# Patient Record
Sex: Female | Born: 1985
Health system: Southern US, Community
[De-identification: ages and names within clinical notes are randomized; demographics above are authoritative.]

## PROBLEM LIST (undated history)

## (undated) DIAGNOSIS — K759 Inflammatory liver disease, unspecified: Secondary | ICD-10-CM

## (undated) DIAGNOSIS — F988 Other specified behavioral and emotional disorders with onset usually occurring in childhood and adolescence: Secondary | ICD-10-CM

## (undated) HISTORY — PX: TONSILLECTOMY AND ADENOIDECTOMY: SHX28

## (undated) HISTORY — PX: LIVER BIOPSY: SHX301

---

## 2014-04-07 ENCOUNTER — Emergency Department (HOSPITAL_COMMUNITY): Payer: BC Managed Care – PPO

## 2014-04-07 ENCOUNTER — Emergency Department (HOSPITAL_COMMUNITY)
Admission: EM | Admit: 2014-04-07 | Discharge: 2014-04-07 | Disposition: A | Discharge status: Home / Self Care | Payer: BC Managed Care – PPO | Attending: Emergency Medicine | Admitting: Emergency Medicine

## 2014-04-07 ENCOUNTER — Encounter (HOSPITAL_COMMUNITY): Payer: Self-pay

## 2014-04-07 DIAGNOSIS — R002 Palpitations: Secondary | ICD-10-CM | POA: Diagnosis not present

## 2014-04-07 DIAGNOSIS — Z91040 Latex allergy status: Secondary | ICD-10-CM | POA: Diagnosis not present

## 2014-04-07 DIAGNOSIS — F909 Attention-deficit hyperactivity disorder, unspecified type: Secondary | ICD-10-CM | POA: Diagnosis not present

## 2014-04-07 DIAGNOSIS — Z79899 Other long term (current) drug therapy: Secondary | ICD-10-CM | POA: Diagnosis not present

## 2014-04-07 DIAGNOSIS — I499 Cardiac arrhythmia, unspecified: Secondary | ICD-10-CM | POA: Diagnosis present

## 2014-04-07 HISTORY — DX: Other specified behavioral and emotional disorders with onset usually occurring in childhood and adolescence: F98.8

## 2014-04-07 LAB — CBC WITH DIFFERENTIAL/PLATELET
BASOS ABS: 0 10*3/uL (ref 0.0–0.1)
Basophils Relative: 0 % (ref 0–1)
EOS PCT: 1 % (ref 0–5)
Eosinophils Absolute: 0.1 10*3/uL (ref 0.0–0.7)
HEMATOCRIT: 41.4 % (ref 36.0–46.0)
Hemoglobin: 14.1 g/dL (ref 12.0–15.0)
LYMPHS ABS: 1.7 10*3/uL (ref 0.7–4.0)
Lymphocytes Relative: 21 % (ref 12–46)
MCH: 28.8 pg (ref 26.0–34.0)
MCHC: 34.1 g/dL (ref 30.0–36.0)
MCV: 84.7 fL (ref 78.0–100.0)
MONO ABS: 0.3 10*3/uL (ref 0.1–1.0)
Monocytes Relative: 4 % (ref 3–12)
Neutro Abs: 5.8 10*3/uL (ref 1.7–7.7)
Neutrophils Relative %: 74 % (ref 43–77)
PLATELETS: 306 10*3/uL (ref 150–400)
RBC: 4.89 MIL/uL (ref 3.87–5.11)
RDW: 13.3 % (ref 11.5–15.5)
WBC: 7.8 10*3/uL (ref 4.0–10.5)

## 2014-04-07 LAB — CBG MONITORING, ED: Glucose-Capillary: 103 mg/dL — ABNORMAL HIGH (ref 70–99)

## 2014-04-07 LAB — BASIC METABOLIC PANEL
Anion gap: 15 (ref 5–15)
BUN: 11 mg/dL (ref 6–23)
CALCIUM: 9.5 mg/dL (ref 8.4–10.5)
CO2: 23 meq/L (ref 19–32)
CREATININE: 0.88 mg/dL (ref 0.50–1.10)
Chloride: 103 mEq/L (ref 96–112)
GFR calc Af Amer: >90 mL/min (ref >90)
GFR, EST NON AFRICAN AMERICAN: 87 mL/min — AB (ref >90)
GLUCOSE: 110 mg/dL — AB (ref 70–99)
Potassium: 3.7 mEq/L (ref 3.7–5.3)
Sodium: 141 mEq/L (ref 137–147)

## 2014-04-07 LAB — I-STAT BETA HCG BLOOD, ED (MC, WL, AP ONLY): I-stat hCG, quantitative: <5 m[IU]/mL (ref <5)

## 2014-04-07 LAB — MAGNESIUM: MAGNESIUM: 2 mg/dL (ref 1.5–2.5)

## 2014-04-07 MED ORDER — SODIUM CHLORIDE 0.9 % IV BOLUS (SEPSIS): 1000.0000 mL | Freq: Once | INTRAVENOUS | Status: DC

## 2014-04-07 NOTE — ED Provider Notes (Signed)
CSN: [id number]     Arrival date & time 04/07/14  [id number] History   First MD Initiated Contact with Patient 04/07/14 0353     Chief Complaint  Patient presents with  . Irregular Heart Beat     (Consider location/radiation/quality/duration/timing/severity/associated sxs/prior Treatment) HPI Linda Middleton is a 28 y.o. female with past medical history of ADD coming in with palpitations. This occurred sudden onset around 1 AM. She denies any chest pain but does state she had some shortness of breath. Patient has also been nauseated. She is concerned that her blood sugar as low as this happens to her sometimes. 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 She states sometimes her palpitations last for 2 or 3 days, however they have resolved now in the room. She is now denying any symptoms currently. She states she has not eaten well today due to studying. Patient states her symptoms resolve on their own. She has no further complaints.  10 Systems reviewed and are negative for acute change except as noted in the HPI.     Past Medical History  Diagnosis Date  . Attention deficit disorder (ADD) without hyperactivity    History reviewed. No pertinent past surgical history. History reviewed. No pertinent family history. History  Substance Use Topics  . Smoking status: Never Smoker   . Smokeless tobacco: Not on file  . Alcohol Use: Yes   OB History    No data available     Review of Systems    Allergies  Review of patient's allergies indicates no known allergies.  Home Medications   Prior to Admission medications   Medication Sig Start Date End Date Taking? Authorizing Provider  amphetamine-dextroamphetamine (ADDERALL) 10 MG tablet Take 10 mg by mouth daily with breakfast.   Yes Historical Provider, MD  ibuprofen (ADVIL,MOTRIN) 200 MG tablet Take 400 mg by mouth every 6 (six) hours as needed  for moderate pain.   Yes Historical Provider, MD  Multiple Vitamin (MULTIVITAMIN WITH MINERALS) TABS tablet Take 1 tablet by mouth daily.   Yes Historical Provider, MD  norgestimate-ethinyl estradiol (ORTHO-CYCLEN,SPRINTEC,PREVIFEM) 0.25-35 MG-MCG tablet Take 1 tablet by mouth daily.   Yes Historical Provider, MD  amphetamine-dextroamphetamine (ADDERALL XR) 20 MG 24 hr capsule Take 20 mg by mouth daily.    Historical Provider, MD   BP 120/72 mmHg  Pulse 106  Temp(Src) 98.7 F (37.1 C) (Oral)  Resp 20  Ht 5\' 4"  (1.626 m)  Wt 117 lb (53.071 kg)  BMI 20.07 kg/m2  SpO2 100%  LMP 03/31/2014 Physical Exam  Constitutional: She is oriented to person, place, and time. She appears well-developed and well-nourished. No distress.  HENT:  Head: Normocephalic and atraumatic.  Nose: Nose normal.  Mouth/Throat: Oropharynx is clear and moist. No oropharyngeal exudate.  Eyes: Conjunctivae and EOM are normal. Pupils are equal, round, and reactive to light. No scleral icterus.  Neck: Normal range of motion. Neck supple. No JVD present. No tracheal deviation present. No thyromegaly present.  Cardiovascular: Normal rate, regular rhythm and normal heart sounds.  Exam reveals no gallop and no friction rub.   No murmur heard. Pulmonary/Chest: Effort normal and breath sounds normal. No respiratory distress. She has no wheezes. She exhibits no tenderness.  Abdominal: Soft. Bowel sounds are normal. She exhibits no distension and no mass. There is no tenderness. There is no rebound and no guarding.  Musculoskeletal: Normal range of motion. She  exhibits no edema or tenderness.  Lymphadenopathy:    She has no cervical adenopathy.  Neurological: She is alert and oriented to person, place, and time. No cranial nerve deficit. She exhibits normal muscle tone.  Skin: Skin is warm and dry. No rash noted. No erythema. No pallor.  Nursing note and vitals reviewed.   ED Course  Procedures (including critical care  time) Labs Review Labs Reviewed  BASIC METABOLIC PANEL - Abnormal; Notable for the following:    Glucose, Bld 110 (*)    GFR calc non Af Amer 87 (*)    All other components within normal limits  CBG MONITORING, ED - Abnormal; Notable for the following:    Glucose-Capillary 103 (*)    All other components within normal limits  CBC WITH DIFFERENTIAL  MAGNESIUM  I-STAT BETA HCG BLOOD, ED (MC, WL, AP ONLY)    Imaging Review Dg Chest 2 View  04/07/2014   CLINICAL DATA:  Heart palpitations.  EXAM: CHEST  2 VIEW  COMPARISON:  None.  FINDINGS: The lungs are clear. No pneumothorax or pleural effusion. Heart size is normal. No focal bony abnormality.  IMPRESSION: Negative chest.   Electronically Signed   By: Drusilla Kannerhomas  Dalessio M.D.   On: 04/07/2014 04:29     EKG Interpretation   Date/Time:  Thursday April 07 2014 03:52:45 EST Ventricular Rate:  103 PR Interval:  142 QRS Duration: 85 QT Interval:  346 QTC Calculation: 453 R Axis:   74 Text Interpretation:  Sinus tachycardia RSR' in V1 or V2, probably normal  variant Borderline T abnormalities, anterior leads Baseline wander in  lead(s) V2 Confirmed by Erroll Lunani, Konnar Ben Ayokunle [phone]([id number]) on 04/07/2014  4:03:31 AM      MDM   Final diagnoses:  Palpitations    Patient presents emergency department out of concern for palpitations. Upon my initial assessment in the room, her heart rate is in the 90s. Patient is currently asymptomatic. Fingerstick was103. Patient was encouraged to maintain a healthy appetite and low stress level during exams. We'll obtain laboratory studies, EKG and chest x-ray to evaluate any etiology for her tachycardia.  Patient continues to be asymptomatic, laboratory studies, chest x-ray, EKG are normal. Her vital signs remain within her normal limits, tachycardia has resolved and patient is safe for discharge.    Tomasita CrumbleAdeleke Victorio Creeden, MD 04/07/14 [phone]

## 2014-04-07 NOTE — ED Notes (Signed)
Notified RN,Lisa pt. CBG 103.

## 2014-04-07 NOTE — ED Notes (Signed)
EKG given to EDP,Oni,MD., for review. 

## 2014-04-07 NOTE — Discharge Instructions (Signed)
Palpitations Linda Middleton, you were seen today for your heart racing. Upon arrival to the emergency department, this resolved. He heart rate has been normal here. Her laboratory studies and chest x-ray did not show a cause for her heart racing. Follow-up with a primary care physician within 3 days for continued evaluation. If any symptoms worsen come back to emergency department.  Thank you. A palpitation is the feeling that your heartbeat is irregular. It may feel like your heart is fluttering or skipping a beat. It may also feel like your heart is beating faster than normal. This is usually not a serious problem. In some cases, you may need more medical tests. HOME CARE  Avoid:  Caffeine in coffee, tea, soft drinks, diet pills, and energy drinks.  Chocolate.  Alcohol.  Stop smoking if you smoke.  Reduce your stress and anxiety. Try:  A method that measures bodily functions so you can learn to control them (biofeedback).  Yoga.  Meditation.  Physical activity such as swimming, jogging, or walking.  Get plenty of rest and sleep. GET HELP IF:  Your fast or irregular heartbeat continues after 24 hours.  Your palpitations occur more often. GET HELP RIGHT AWAY IF:   You have chest pain.  You feel short of breath.  You have a very bad headache.  You feel dizzy or pass out (faint). MAKE SURE YOU:   Understand these instructions.  Will watch your condition.  Will get help right away if you are not doing well or get worse. Document Released: 02/20/2008 Document Revised: 09/27/2013 Document Reviewed: 07/12/2011 [facility] Patient Information 2015 [place] [employer] [place]. This information is not intended to replace advice given to you by your health care provider. Make sure you discuss any questions you have with your health care provider.  Emergency Department Resource Guide 1) Find a Doctor and Pay Out of Pocket Although you won't have to find out who is covered by your insurance plan,  it is a good idea to ask around and get recommendations. You will then need to call the office and see if the doctor you have chosen will accept you as a new patient and what types of options they offer for patients who are self-pay. Some doctors offer discounts or will set up payment plans for their patients who do not have insurance, but you will need to ask so you aren't surprised when you get to your appointment.  2) Contact Your Local Health Department Not all health departments have doctors that can see patients for sick visits, but many do, so it is worth a call to see if yours does. If you don't know where your local health department is, you can check in your phone book. The CDC also has a tool to help you locate your state's health department, and many state websites also have listings of all of their local health departments.  3) Find a Walk-in Clinic If your illness is not likely to be very severe or complicated, you may want to try a walk in clinic. These are popping up all over the country in pharmacies, drugstores, and shopping centers. They're usually staffed by nurse practitioners or physician assistants that have been trained to treat common illnesses and complaints. They're usually fairly quick and inexpensive. However, if you have serious medical issues or chronic medical problems, these are probably not your best option.  No Primary Care Doctor: - Call Health Connect at  [phone] - they can help you locate a primary care doctor  that  accepts your insurance, provides certain services, etc. - Physician Referral Service- [phone]  Chronic Pain Problems: Organization         Address  Phone   Notes  [facility]  [phone] Patients need to be referred by their primary care doctor.   Medication Assistance: Organization         Address  Phone   Notes  [facility] Medication [facility] [address] [place], [place] [address] [phone])  [phone] --Must be a resident of [facility] [place] -- Must have NO insurance coverage whatsoever (no Medicaid/ Medicare, etc.) -- The pt. MUST have a primary care doctor that directs their care regularly and follows them in the community   MedAssist  [phone]) [phone]   [employer]  [phone]) [phone]    Agencies that provide inexpensive medical care: Organization         Address  Phone   Notes  [facility]  [phone]) [phone]   [facility]    [phone](336) [phone]   [facility] [address] [place], [place] [address] [phone]) [phone]   [facility] [place] [address], [place] [phone]) [phone]   [facility]    [phone]) [phone]   [facility]    [phone](336) [phone]   [facility]  [address], [place] Phone:  [phone]) [phone], Fax:  [phone] Hours of Operation:  9 am - 6 pm, M-F.  Also accepts Medicaid/Medicare and self-pay.  [facility]  [address], [place] Phone: [phone](336) [phone], Fax: [phone]. Hours of Operation:  8:30 am - 5:30 pm, M-F.  Also accepts Medicaid and self-pay.  [facility] [place] [address], [place] Phone: [phone]) [phone]   [facility] [address], [place], [place] [phone], Ext. [phone] Mondays & Thursdays: 7-9 AM.  First 15 patients are seen on a first come, first serve basis.    Medicaid-accepting [facility] Providers:  Organization         Address  Phone   Notes  [facility] [address], [place] [phone]) [phone] Also accepts self-pay patients.  [facility] [address], [place]  [phone]) [phone]   [facility] [address], [place] [phone](336) [phone]   [facility] [address] [place] [address], [place] [phone]) [phone]   Renaye RakersVeita Bland [address], [place]   [phone])  [phone] Only accepts [place] Access [place] patients after they have their name applied to their card.   Self-Pay (no insurance) in [facility] [place]:  Organization         Address  Phone   Notes  Sickle Cell Patients, [facility] [address], [place] [phone]   [facility] [address], [place] [phone]) [phone]   [facility] [place]  [address], [place] [phone]) [phone]   [facility]/Dr. Osei-Bonsu  [address] [place] [address], [place] or [phone], [place] [phone] Phone number for both [place] and [place] locations is the same.  [facility] [address], [place] [phone](336) [phone]   [facility] [place] [address] [place] [address], [place] or [address] [phone]  [facility] [address] [place] [phone], phone; [phone], fax Sees patients 1st and 3rd Saturday of every month.  Must not qualify for public or private insurance (i.e. Medicaid, Medicare, [place] Health Choice, Veterans' Benefits)  Household income should be no more than 200% of the poverty level The clinic cannot treat you if you are pregnant or think you are pregnant  Sexually transmitted diseases are not treated at the clinic.    Dental Care: Organization         Address  Phone  Notes  [facility] [address] [place], [place] [phone] Accepts children up to age 88 who are enrolled in [place] or [place] Health Choice; pregnant women with a Medicaid card; and children who have applied for Medicaid or [place] Health Choice, but were declined, whose parents can pay a reduced fee at time of service.  [facility] Department of [facility]  [address], [place] [phone] Accepts children up to age 27 who are enrolled in [place] or [place] Health  Choice; pregnant women with a Medicaid card; and children who have applied for Medicaid or [place] Health Choice, but were declined, whose parents can pay a reduced fee at time of service.  [facility]  [address] [place], [place] [phone] Patients are seen by appointment only. Walk-ins are not accepted. [facility] will see patients 53 years of age and older. Monday - Tuesday (8am-5pm) Most Wednesdays (8:30-5pm) $30 per visit, cash only  [facility] Access [facility]  [address], [facility] [phone] Patients are seen by appointment only. Walk-ins are not accepted. [facility] will see patients 64 years of age and older. One Wednesday Evening (Monthly: Volunteer Based).  $30 per visit, cash only  [employer]  [phone] for adults; Children under age 34, call Graduate Pediatric Dentistry at [phone]. Children aged 21-14, please call [phone] to request a pediatric application.  Dental services are provided in all areas of dental care including fillings, crowns and bridges, complete and partial dentures, implants, gum treatment, root canals, and extractions. Preventive care is also provided. Treatment is provided to both adults and children. Patients are selected via a lottery and there is often a waiting list.   [facility] [address], [place]  [phone] [email or web address]   [facility] [address] [place], [place] [phone], Ext. [phone] Second and Fourth Thursday of each month, opens at 6:30 AM; Clinic ends at 9 AM.  Patients are seen on a first-come first-served basis, and a limited number are seen during each clinic.   [facility]  [address] [place], [place] [phone]   Eligibility Requirements You must have lived in [place], [place], or [place] counties for at least the last three months.   You cannot be eligible for state or  federal sponsored [place], including [employer], [place], or [employer].   You generally cannot be eligible for healthcare insurance through your employer.    How to apply: Eligibility screenings are held every Tuesday and Wednesday afternoon from 1:00 pm until 4:00 pm. You do not need an appointment for the interview!  [facility] [address], [place], [place] [phone]   [facility] Health Department  [phone]   [facility] Health Department  [phone]   [facility]  Department  [phone]    Behavioral Health Resources in the Community: Intensive Outpatient Programs Organization         Address  Phone  Notes  [facility] Services [address] [place], [place] [phone]   [facility]patient [address], [place], [place] [phone]   [facility] [address], [place], [place]  [phone]   [facility] [address]  [place], [place] [phone] or [phone]   Substance Abuse Resources Organization         Address  Phone  Notes  Al[facility] Drug Services  [phone]   [facility]  ([phone]   [facility]  [phone]   Floydene Flock  ([phone]   Residential & Outpatient Substance Abuse Program  [phone]   Psychological Services Organization         Address  Phone  Notes  [facility] Behavioral Health  [phone]   [facility] Services  [phone]   [facility] Mental Health [address] [place] [phone] or [phone]    Mobile Crisis Teams Organization         Address  Phone  Notes  [facility], Mobile Crisis Care Unit  [phone]   [facility]  [address] [place], [place] [phone]   Doristine Locks [address] [place] [phone]    Self-Help/Support Groups Organization         Address  Phone              Notes  [facility] [place] - variety of support groups  [phone]- I7437963 Call for more information  Narcotics Anonymous (NA), [facility] [address], [employer] [place]  2 meetings at this location   Statistician         Address  Phone  Notes  [facility] [address],    [place]  [phone]   [facility]  [address], [place] [address], [place], [place] [phone]   [facility] [address] [place], [place] [phone] Admissions: 8am-3pm M-F  [facility] [address]    [place], [place] [phone]   [facility] [address] [place], [place], [place] [phone]   [facility] [address],  [place], [place] [phone]   [facility] - Intensive Outpatient [address], [place], [place] [phone]   [facility] ([facility] [address]  [place], [place] [phone] or [phone]   [facility] ([facility]) [address] [place], [place] [phone] Accepts Medicaid  [facility] [address],  [place] [phone] Substance Abuse/Addiction Treatment   [facility] Organization         Address  Phone  Notes  [facility]  [phone]   Angie Fava, PhD [address] [place], [place]   [phone] or [phone]   [facility] Behavioral   [address] [place], [place] [phone]   [facility] [address] [place], [place] [phone] Insurance/Medicaid/sponsorship through [employer] and Families [address]                                    [place], [place] [phone] Therapy/tele-psych/case  [facility]  [address]   [place], [place] [phone]) [phone]    Dr. Lolly MustacheArfeen  [phone]) [phone]   [facility]  [employer] [facility]  Dept. 1) [address], [place] 2) [address], [place] 3)  [address], [place] [phone](336) [phone]) [phone]  [phone](336) [phone]   [facility] Child Abuse Hotline [phone](336) [phone] or [phone](336) [phone] (After Hours)

## 2014-04-07 NOTE — ED Notes (Signed)
Pt complains of tachycardia, she states that its stress related and its exam time, also she says that she has hypoglycemia and hasn't eaten today

## 2014-04-07 NOTE — ED Notes (Signed)
Returned from xray

## 2015-03-18 ENCOUNTER — Encounter (HOSPITAL_COMMUNITY): Payer: Self-pay | Admitting: Emergency Medicine

## 2015-03-18 DIAGNOSIS — R319 Hematuria, unspecified: Secondary | ICD-10-CM | POA: Diagnosis not present

## 2015-03-18 DIAGNOSIS — Z79899 Other long term (current) drug therapy: Secondary | ICD-10-CM | POA: Diagnosis not present

## 2015-03-18 NOTE — ED Notes (Signed)
Pt reports that she had fever 5 nights ago and that yesterday she noticed discoloration to urine. Pt denies urinary pain, N/V/D, but tenderness this evening. Pt ambulatory to triage, a/o at this time, and NAD.

## 2015-03-19 ENCOUNTER — Emergency Department (HOSPITAL_COMMUNITY): Payer: 59

## 2015-03-19 ENCOUNTER — Emergency Department (HOSPITAL_COMMUNITY)
Admission: EM | Admit: 2015-03-19 | Discharge: 2015-03-19 | Disposition: A | Discharge status: Home / Self Care | Payer: 59 | Attending: Emergency Medicine | Admitting: Emergency Medicine

## 2015-03-19 ENCOUNTER — Encounter (HOSPITAL_COMMUNITY): Payer: Self-pay | Admitting: Vascular Surgery

## 2015-03-19 ENCOUNTER — Emergency Department (HOSPITAL_COMMUNITY)
Admission: EM | Admit: 2015-03-19 | Discharge: 2015-03-20 | Disposition: A | Discharge status: Home / Self Care | Payer: 59 | Source: Home / Self Care | Attending: Emergency Medicine | Admitting: Emergency Medicine

## 2015-03-19 DIAGNOSIS — R1011 Right upper quadrant pain: Secondary | ICD-10-CM

## 2015-03-19 DIAGNOSIS — K759 Inflammatory liver disease, unspecified: Secondary | ICD-10-CM

## 2015-03-19 DIAGNOSIS — R319 Hematuria, unspecified: Secondary | ICD-10-CM

## 2015-03-19 DIAGNOSIS — R109 Unspecified abdominal pain: Secondary | ICD-10-CM

## 2015-03-19 DIAGNOSIS — R509 Fever, unspecified: Secondary | ICD-10-CM

## 2015-03-19 LAB — CBC
HCT: 39 % (ref 36.0–46.0)
HEMOGLOBIN: 13 g/dL (ref 12.0–15.0)
MCH: 28.3 pg (ref 26.0–34.0)
MCHC: 33.3 g/dL (ref 30.0–36.0)
MCV: 84.8 fL (ref 78.0–100.0)
PLATELETS: 130 10*3/uL — AB (ref 150–400)
RBC: 4.6 MIL/uL (ref 3.87–5.11)
RDW: 12.7 % (ref 11.5–15.5)
WBC: 6.8 10*3/uL (ref 4.0–10.5)

## 2015-03-19 LAB — I-STAT CHEM 8, ED
BUN: 7 mg/dL (ref 6–20)
Calcium, Ion: 1.16 mmol/L (ref 1.12–1.23)
Chloride: 98 mmol/L — ABNORMAL LOW (ref 101–111)
Creatinine, Ser: 1 mg/dL (ref 0.44–1.00)
Glucose, Bld: 95 mg/dL (ref 65–99)
HCT: 34 % — ABNORMAL LOW (ref 36.0–46.0)
Hemoglobin: 11.6 g/dL — ABNORMAL LOW (ref 12.0–15.0)
POTASSIUM: 3.6 mmol/L (ref 3.5–5.1)
SODIUM: 137 mmol/L (ref 135–145)
TCO2: 26 mmol/L (ref 0–100)

## 2015-03-19 LAB — URINALYSIS, ROUTINE W REFLEX MICROSCOPIC
GLUCOSE, UA: NEGATIVE mg/dL
GLUCOSE, UA: NEGATIVE mg/dL
HGB URINE DIPSTICK: NEGATIVE
KETONES UR: NEGATIVE mg/dL
KETONES UR: NEGATIVE mg/dL
LEUKOCYTES UA: NEGATIVE
LEUKOCYTES UA: NEGATIVE
NITRITE: NEGATIVE
Nitrite: NEGATIVE
PH: 7 (ref 5.0–8.0)
Protein, ur: NEGATIVE mg/dL
Protein, ur: NEGATIVE mg/dL
SPECIFIC GRAVITY, URINE: 1.015 (ref 1.005–1.030)
Specific Gravity, Urine: 1.012 (ref 1.005–1.030)
Urobilinogen, UA: 1 mg/dL (ref 0.0–1.0)
Urobilinogen, UA: 1 mg/dL (ref 0.0–1.0)
pH: 6.5 (ref 5.0–8.0)

## 2015-03-19 LAB — COMPREHENSIVE METABOLIC PANEL
ALBUMIN: 3.4 g/dL — AB (ref 3.5–5.0)
ALT: 271 U/L — AB (ref 14–54)
AST: 257 U/L — AB (ref 15–41)
Alkaline Phosphatase: 215 U/L — ABNORMAL HIGH (ref 38–126)
Anion gap: 8 (ref 5–15)
BUN: 6 mg/dL (ref 6–20)
CO2: 27 mmol/L (ref 22–32)
CREATININE: 0.87 mg/dL (ref 0.44–1.00)
Calcium: 8.9 mg/dL (ref 8.9–10.3)
Chloride: 98 mmol/L — ABNORMAL LOW (ref 101–111)
GFR calc Af Amer: >60 mL/min (ref >60)
GFR calc non Af Amer: >60 mL/min (ref >60)
Glucose, Bld: 102 mg/dL — ABNORMAL HIGH (ref 65–99)
POTASSIUM: 4 mmol/L (ref 3.5–5.1)
SODIUM: 133 mmol/L — AB (ref 135–145)
Total Bilirubin: 1.7 mg/dL — ABNORMAL HIGH (ref 0.3–1.2)
Total Protein: 6.4 g/dL — ABNORMAL LOW (ref 6.5–8.1)

## 2015-03-19 LAB — URINE MICROSCOPIC-ADD ON

## 2015-03-19 LAB — POC URINE PREG, ED: PREG TEST UR: NEGATIVE

## 2015-03-19 LAB — LIPASE, BLOOD: LIPASE: 44 U/L (ref 11–51)

## 2015-03-19 LAB — I-STAT CG4 LACTIC ACID, ED: LACTIC ACID, VENOUS: 0.53 mmol/L (ref 0.5–2.0)

## 2015-03-19 MED ORDER — ACETAMINOPHEN 325 MG PO TABS
ORAL_TABLET | ORAL | Status: AC
  Filled 2015-03-19: qty 2

## 2015-03-19 MED ORDER — ACETAMINOPHEN 325 MG PO TABS
650.0000 mg | ORAL_TABLET | Freq: Once | ORAL | Status: AC | PRN
  Administered 2015-03-19: 650 mg via ORAL

## 2015-03-19 NOTE — Discharge Instructions (Signed)
Hematuria, Adult Linda Middleton, your kidney function and ultrasound were normal today. Urine does show blood in it. He need to see urology within 3 days for close follow-up. If any symptoms worsen come back to the emergency department immediately. Thank you. Hematuria is blood in your urine. It can be caused by a bladder infection, kidney infection, prostate infection, kidney stone, or cancer of your urinary tract. Infections can usually be treated with medicine, and a kidney stone usually will pass through your urine. If neither of these is the cause of your hematuria, further workup to find out the reason may be needed. It is very important that you tell your health care provider about any blood you see in your urine, even if the blood stops without treatment or happens without causing pain. Blood in your urine that happens and then stops and then happens again can be a symptom of a very serious condition. Also, pain is not a symptom in the initial stages of many urinary cancers. HOME CARE INSTRUCTIONS   Drink lots of fluid, 3-4 quarts a day. If you have been diagnosed with an infection, cranberry juice is especially recommended, in addition to large amounts of water.  Avoid caffeine, tea, and carbonated beverages because they tend to irritate the bladder.  Avoid alcohol because it may irritate the prostate.  Take all medicines as directed by your health care provider.  If you were prescribed an antibiotic medicine, finish it all even if you start to feel better.  If you have been diagnosed with a kidney stone, follow your health care provider's instructions regarding straining your urine to catch the stone.  Empty your bladder often. Avoid holding urine for long periods of time.  After a bowel movement, women should cleanse front to back. Use each tissue only once.  Empty your bladder before and after sexual intercourse if you are a female. SEEK MEDICAL CARE IF:  You develop back pain.  You  have a fever.  You have a feeling of sickness in your stomach (nausea) or vomiting.  Your symptoms are not better in 3 days. Return sooner if you are getting worse. SEEK IMMEDIATE MEDICAL CARE IF:   You develop severe vomiting and are unable to keep the medicine down.  You develop severe back or abdominal pain despite taking your medicines.  You begin passing a large amount of blood or clots in your urine.  You feel extremely weak or faint, or you pass out. MAKE SURE YOU:   Understand these instructions.  Will watch your condition.  Will get help right away if you are not doing well or get worse.   This information is not intended to replace advice given to you by your health care provider. Make sure you discuss any questions you have with your health care provider.   Document Released: 05/13/2005 Document Revised: 06/03/2014 Document Reviewed: 01/11/2013 Elsevier Interactive Patient Education [employer]vier Inc.

## 2015-03-19 NOTE — ED Notes (Signed)
Pt was seen last pm for fever, headache, abd pain/flank pain, and hematuria. She had a negative workup and d/c home. However, at work today she developed some nausea and her fever returned. Pt denies any active vomiting or diarrhea. Pt also reports increased blood in her urine. Pt A&OX4, resp e/u, and skin warm and dry.

## 2015-03-19 NOTE — ED Provider Notes (Signed)
CSN: [id number]     Arrival date & time 03/19/15  1848 History   First MD Initiated Contact with Patient 03/19/15 1948     Chief Complaint  Patient presents with  . Fever     (Consider location/radiation/quality/duration/timing/severity/associated sxs/prior Treatment) HPI Comments: Linda Middleton is a 29 year old female with ADD who presents with 6 day history of fevers and chills. She reports that 6 days ago she developed intermittent low grade fevers ranging 99.8-100.2 with no other symptoms. She continued to have intermittent fevers and was seen at the [facility] clinic and tested for flu. Rapid flu was negative at that time. She developed hematuria 2 days ago and came to to the ED last night for evaluation. She was afebrile during her ED visit with U/A remarkable for hematuria but no signs of infection. Renal ultrasound was unremarkable with no hydronephrosis. She was discharged to follow up with PCP for the hematuria.   Today, she developed RUQ pain and nausea without any emesis and returned for further evaluation. She reports continued fevers and hematuria. Denies any dysuria, flank pain, diarrhea, melena or vaginal discharge or bleeding. Denies any recent travel or any sick contacts.   Patient is a 29 y.o. female presenting with fever. The history is provided by the patient.  Fever Associated symptoms: chills, headaches and nausea   Associated symptoms: no diarrhea, no dysuria and no vomiting     Past Medical History  Diagnosis Date  . Attention deficit disorder (ADD) without hyperactivity    Past Surgical History  Procedure Laterality Date  . Tonsillectomy and adenoidectomy     No family history on file. Social History  Substance Use Topics  . Smoking status: Never Smoker   . Smokeless tobacco: None  . Alcohol Use: 0.6 - 1.2 oz/week    1-2 Glasses of wine per week   OB History    No data available     Review of Systems  Constitutional: Positive for fever and chills. Negative  for appetite change and fatigue.  Eyes: Negative.   Respiratory: Negative.   Cardiovascular: Negative.   Gastrointestinal: Positive for nausea and abdominal pain. Negative for vomiting, diarrhea, constipation and blood in stool.  Endocrine: Negative.   Genitourinary: Positive for hematuria. Negative for dysuria, urgency, frequency, flank pain and pelvic pain.  Musculoskeletal: Negative.   Skin: Negative.   Allergic/Immunologic: Negative.   Neurological: Positive for headaches. Negative for dizziness and numbness.  Hematological: Negative.   Psychiatric/Behavioral: Negative.    Allergies  Review of patient's allergies indicates no known allergies.  Home Medications   Prior to Admission medications   Medication Sig Start Date End Date Taking? Authorizing Provider  amphetamine-dextroamphetamine (ADDERALL XR) 20 MG 24 hr capsule Take 20 mg by mouth daily.   Yes Historical Provider, MD  BIOTIN PO Take 1 tablet by mouth daily.   Yes Historical Provider, MD  ibuprofen (ADVIL,MOTRIN) 200 MG tablet Take 600-800 mg by mouth every 8 (eight) hours as needed for headache or moderate pain.    Yes Historical Provider, MD  Multiple Vitamin (MULTIVITAMIN WITH MINERALS) TABS tablet Take 1 tablet by mouth daily.   Yes Historical Provider, MD  norgestimate-ethinyl estradiol (ORTHO-CYCLEN,SPRINTEC,PREVIFEM) 0.25-35 MG-MCG tablet Take 1 tablet by mouth daily.   Yes Historical Provider, MD   BP 100/53 mmHg  Pulse 71  Temp(Src) 100.6 F (38.1 C) (Oral)  Resp 16  SpO2 99%  LMP 03/04/2015 (Approximate) Physical Exam  Constitutional: She is oriented to person, place, and time. She  appears well-developed and well-nourished. No distress.  HENT:  Head: Normocephalic and atraumatic.  Eyes: Conjunctivae and EOM are normal. Pupils are equal, round, and reactive to light.  Neck: Normal range of motion. Neck supple.  Cardiovascular: Normal rate, regular rhythm and normal heart sounds.  Exam reveals no gallop  and no friction rub.   No murmur heard. Pulmonary/Chest: Effort normal and breath sounds normal. She has no wheezes. She has no rales.  Abdominal: Soft. Bowel sounds are normal. She exhibits no distension. There is tenderness in the right upper quadrant. There is no rebound, no guarding and no CVA tenderness.  Musculoskeletal: Normal range of motion.  Neurological: She is alert and oriented to person, place, and time.  Skin: Skin is warm and dry. No erythema.  Psychiatric: She has a normal mood and affect.    ED Course  Procedures (including critical care time) Labs Review Labs Reviewed  COMPREHENSIVE METABOLIC PANEL - Abnormal; Notable for the following:    Sodium 133 (*)    Chloride 98 (*)    Glucose, Bld 102 (*)    Total Protein 6.4 (*)    Albumin 3.4 (*)    AST 257 (*)    ALT 271 (*)    Alkaline Phosphatase 215 (*)    Total Bilirubin 1.7 (*)    All other components within normal limits  URINALYSIS, ROUTINE W REFLEX MICROSCOPIC (NOT AT [facility]) - Abnormal; Notable for the following:    Color, Urine AMBER (*)    APPearance CLOUDY (*)    Bilirubin Urine SMALL (*)    All other components within normal limits  CBC - Abnormal; Notable for the following:    Platelets 130 (*)    All other components within normal limits  URINE CULTURE  LIPASE, BLOOD  PREGNANCY, URINE  HEPATITIS PANEL, ACUTE  I-STAT CG4 LACTIC ACID, ED  POC URINE PREG, ED    Imaging Review US Abdomen Complete  03/19/2015  CLINICAL DATA:  29 year old female with hematuria and low-grade fever for the past 4 days with discolored urine for the past 2 days. Lower abdominal tenderness. EXAM: ULTRASOUND ABDOMEN COMPLETE COMPARISON:  None. FINDINGS: Gallbladder: No gallstones or wall thickening visualized. No sonographic Murphy sign noted. Common bile duct: Diameter: 2.1 mm in the porta hepatis. Liver: No focal lesion identified. Within normal limits in parenchymal echogenicity. IVC: No abnormality visualized. Pancreas:  Visualized portion unremarkable. Spleen: Size and appearance within normal limits.  7.5 cm in length. Right Kidney: Length: 10.9 cm. Echogenicity within normal limits. No mass or hydronephrosis visualized. Left Kidney: Length: 11.7 cm. Echogenicity within normal limits. No mass or hydronephrosis visualized. Abdominal aorta: No aneurysm visualized. Other findings: None. IMPRESSION: 1. No acute findings. Specifically, no gallstones and no findings to suggest an acute cholecystitis at this time. Electronically Signed   By: Vinnie Langton M.D.   On: 03/19/2015 22:54   US Renal  03/19/2015  CLINICAL DATA:  Acute onset of hematuria and low-grade fever. Lower abdominal tenderness. Initial encounter. EXAM: RENAL / URINARY TRACT ULTRASOUND COMPLETE COMPARISON:  None. FINDINGS: Right Kidney: Length: 10.4 cm. Echogenicity within normal limits. Incompletely rotated. No mass or hydronephrosis visualized. Left Kidney: Length: 11.2 cm. Echogenicity within normal limits. No mass or hydronephrosis visualized. Bladder: Appears normal for degree of bladder distention. Bilateral ureteral jets are visualized. IMPRESSION: No evidence of hydronephrosis.  Unremarkable renal ultrasound. Electronically Signed   By: Garald Balding M.D.   On: 03/19/2015 03:48   I have personally reviewed and evaluated these  images and lab results as part of my medical decision-making.   EKG Interpretation None      MDM   Final diagnoses:  Abdominal pain   Patient with fevers, abdominal pain and nausea. LFTs noted to be elevated today with AST 257, ALT 271, Alk Phos 215 and mildly elevated bili at 1.7. She does have RUQ pain on exam with no guarding or rigidity. CBC and bmet with no significant abnormalities. Lactate and lipase wnl. UA done today is normal with no hematuria.   Abdominal ultrasound with no acute findings. No gallstones or any signs of acute cholecystitis.   Hepatitis panel is sent and pending. Will get CT scan of  abdomen/pelvis and if negative will discharge home with follow up with her PCP for final results of the hepatitis panel.    Maryellen Pile, MD 03/19/15 [phone]

## 2015-03-19 NOTE — ED Notes (Signed)
MD student at bedside

## 2015-03-19 NOTE — ED Provider Notes (Signed)
CSN: [id number]     Arrival date & time 03/18/15  2331 History  By signing my name below, I, Linda Middleton, attest that this documentation has been prepared under the direction and in the presence of Tomasita CrumbleAdeleke Tyress Loden, MD. Electronically Signed: Octavia HeirArianna Middleton, ED Scribe. 03/19/2015. 1:02 AM.    Chief Complaint  Patient presents with  . Hematuria      The history is provided by the patient. No language interpreter was used.   HPI Comments: Linda Middleton is a 29 y.o. female who presents to the Emergency Department complaining of sudden, gradual worsening hematuria onset last night. She reports about 5 days ago she has been having an intermittent low grade fever ranging from 99.8-100.2. [facility] and was tested for flu and was told it was some kind of virus. She began to have associated generalized body aches and notes she has been taking multiple tylenol to alleviate the pain with relief. She states yesterday she noticed a cloudy discoloration in her urine that was not normal. She states tonight she began to feels LLQ tenderness with a pulling sensation in her left flank. She states having a UTI in the past but notes it was a very long time ago. Pt denies dysuria, hx of kidney stones, abnormal vaginal bleeding, and abnormal discharge.  Past Medical History  Diagnosis Date  . Attention deficit disorder (ADD) without hyperactivity    Past Surgical History  Procedure Laterality Date  . Tonsillectomy and adenoidectomy     No family history on file. Social History  Substance Use Topics  . Smoking status: Never Smoker   . Smokeless tobacco: None  . Alcohol Use: 0.6 - 1.2 oz/week    1-2 Glasses of wine per week   OB History    No data available     Review of Systems  A complete 10 system review of systems was obtained and all systems are negative except as noted in the HPI and PMH.    Allergies  Review of patient's allergies indicates no known allergies.  Home  Medications   Prior to Admission medications   Medication Sig Start Date End Date Taking? Authorizing Provider  amphetamine-dextroamphetamine (ADDERALL XR) 20 MG 24 hr capsule Take 20 mg by mouth daily.    Historical Provider, MD  amphetamine-dextroamphetamine (ADDERALL) 10 MG tablet Take 10 mg by mouth daily with breakfast.    Historical Provider, MD  ibuprofen (ADVIL,MOTRIN) 200 MG tablet Take 400 mg by mouth every 6 (six) hours as needed for moderate pain.    Historical Provider, MD  Multiple Vitamin (MULTIVITAMIN WITH MINERALS) TABS tablet Take 1 tablet by mouth daily.    Historical Provider, MD  norgestimate-ethinyl estradiol (ORTHO-CYCLEN,SPRINTEC,PREVIFEM) 0.25-35 MG-MCG tablet Take 1 tablet by mouth daily.    Historical Provider, MD   Triage vitals: BP 110/60 mmHg  Pulse 88  Temp(Src) 98.6 F (37 C) (Oral)  Resp 18  Ht 5\' 4"  (1.626 m)  Wt 125 lb 5 oz (56.841 kg)  BMI 21.50 kg/m2  SpO2 99%  LMP 03/04/2015 (Approximate) Physical Exam  Constitutional: She is oriented to person, place, and time. She appears well-developed and well-nourished. No distress.  HENT:  Head: Normocephalic and atraumatic.  Nose: Nose normal.  Mouth/Throat: Oropharynx is clear and moist. No oropharyngeal exudate.  Eyes: Conjunctivae and EOM are normal. Pupils are equal, round, and reactive to light. No scleral icterus.  Neck: Normal range of motion. Neck supple. No JVD present. No tracheal deviation  present. No thyromegaly present.  Cardiovascular: Normal rate, regular rhythm and normal heart sounds.  Exam reveals no gallop and no friction rub.   No murmur heard. Pulmonary/Chest: Effort normal and breath sounds normal. No respiratory distress. She has no wheezes. She exhibits no tenderness.  Abdominal: Soft. Bowel sounds are normal. She exhibits no distension and no mass. There is no tenderness. There is no rebound and no guarding.  Musculoskeletal: Normal range of motion. She exhibits no edema or  tenderness.  Lymphadenopathy:    She has no cervical adenopathy.  Neurological: She is alert and oriented to person, place, and time. No cranial nerve deficit. She exhibits normal muscle tone.  Skin: Skin is warm and dry. No rash noted. No erythema. No pallor.  Nursing note and vitals reviewed.   ED Course  Procedures  DIAGNOSTIC STUDIES: Oxygen Saturation is 99% on RA, normal by my interpretation.  COORDINATION OF CARE:  12:54 AM Discussed treatment plan which includes [place] of abdomen with pt at bedside and pt agreed to plan.  Labs Review Labs Reviewed  URINALYSIS, ROUTINE W REFLEX MICROSCOPIC (NOT AT [facility]) - Abnormal; Notable for the following:    Color, Urine AMBER (*)    APPearance CLOUDY (*)    Hgb urine dipstick TRACE (*)    Bilirubin Urine SMALL (*)    All other components within normal limits  URINE MICROSCOPIC-ADD ON - Abnormal; Notable for the following:    Squamous Epithelial / LPF FEW (*)    Bacteria, UA FEW (*)    All other components within normal limits  I-STAT CHEM 8, ED - Abnormal; Notable for the following:    Chloride 98 (*)    Hemoglobin 11.6 (*)    HCT 34.0 (*)    All other components within normal limits  POC URINE PREG, ED    Imaging Review US Renal  03/19/2015  CLINICAL DATA:  Acute onset of hematuria and low-grade fever. Lower abdominal tenderness. Initial encounter. EXAM: RENAL / URINARY TRACT ULTRASOUND COMPLETE COMPARISON:  None. FINDINGS: Right Kidney: Length: 10.4 cm. Echogenicity within normal limits. Incompletely rotated. No mass or hydronephrosis visualized. Left Kidney: Length: 11.2 cm. Echogenicity within normal limits. No mass or hydronephrosis visualized. Bladder: Appears normal for degree of bladder distention. Bilateral ureteral jets are visualized. IMPRESSION: No evidence of hydronephrosis.  Unremarkable renal ultrasound. Electronically Signed   By: Roanna Raider M.D.   On: 03/19/2015 03:48   I have personally reviewed and evaluated  these images and lab results as part of my medical decision-making.   EKG Interpretation None      MDM   Final diagnoses:  None    Patient presents emergency department for subjective fever and hematuria.  No true fever at home documented. Patient is afebrile here. She has no dysuria. UA does reveal hematuria. Will obtain renal ultrasound to assess for hydronephrosis.  Ultrasound negative for any hydronephrosis. Patient will require follow-up for hematuria. She appears well in no acute distress, vital signs remain within her normal limits and she is safe for discharge.   I, Jolicia Delira, personally performed the services described in this documentation. All medical record entries made by the scribe were at my direction and in my presence.  I have reviewed the chart and discharge instructions and agree that the record reflects my personal performance and is accurate and complete. Charmeka [place].  03/19/2015. 1:09 AM.     Tomasita Crumble, MD 03/19/15 [phone]

## 2015-03-19 NOTE — ED Notes (Signed)
MD at bedside. 

## 2015-03-19 NOTE — ED Notes (Signed)
Pt preg test negative, ready for CT

## 2015-03-19 NOTE — ED Notes (Signed)
Patient transported to Ultrasound 

## 2015-03-19 NOTE — ED Provider Notes (Signed)
I saw and evaluated the patient, reviewed the resident's note and I agree with the findings and plan.   EKG Interpretation None       Results for orders placed or performed during the hospital encounter of 03/19/15  Comprehensive metabolic panel  Result Value Ref Range   Sodium 133 (L) 135 - 145 mmol/L   Potassium 4.0 3.5 - 5.1 mmol/L   Chloride 98 (L) 101 - 111 mmol/L   CO2 27 22 - 32 mmol/L   Glucose, Bld 102 (H) 65 - 99 mg/dL   BUN 6 6 - 20 mg/dL   Creatinine, Ser 4.090.87 0.44 - 1.00 mg/dL   Calcium 8.9 8.9 - 81.110.3 mg/dL   Total Protein 6.4 (L) 6.5 - 8.1 g/dL   Albumin 3.4 (L) 3.5 - 5.0 g/dL   AST 914257 (H) 15 - 41 U/L   ALT 271 (H) 14 - 54 U/L   Alkaline Phosphatase 215 (H) 38 - 126 U/L   Total Bilirubin 1.7 (H) 0.3 - 1.2 mg/dL   GFR calc non Af Amer >60 >60 mL/min   GFR calc Af Amer >60 >60 mL/min   Anion gap 8 5 - 15  Urinalysis, Routine w reflex microscopic (not at [facility])  Result Value Ref Range   Color, Urine AMBER (A) YELLOW   APPearance CLOUDY (A) CLEAR   Specific Gravity, Urine 1.012 1.005 - 1.030   pH 6.5 5.0 - 8.0   Glucose, UA NEGATIVE NEGATIVE mg/dL   Hgb urine dipstick NEGATIVE NEGATIVE   Bilirubin Urine SMALL (A) NEGATIVE   Ketones, ur NEGATIVE NEGATIVE mg/dL   Protein, ur NEGATIVE NEGATIVE mg/dL   Urobilinogen, UA 1.0 0.0 - 1.0 mg/dL   Nitrite NEGATIVE NEGATIVE   Leukocytes, UA NEGATIVE NEGATIVE  CBC  Result Value Ref Range   WBC 6.8 4.0 - 10.5 K/uL   RBC 4.60 3.87 - 5.11 MIL/uL   Hemoglobin 13.0 12.0 - 15.0 g/dL   HCT 78.239.0 95.636.0 - 21.346.0 %   MCV 84.8 78.0 - 100.0 fL   MCH 28.3 26.0 - 34.0 pg   MCHC 33.3 30.0 - 36.0 g/dL   RDW 08.612.7 57.811.5 - 46.915.5 %   Platelets 130 (L) 150 - 400 K/uL  Lipase, blood  Result Value Ref Range   Lipase 44 11 - 51 U/L  I-Stat CG4 Lactic Acid, ED  (not at [facility])  Result Value Ref Range   Lactic Acid, Venous 0.53 0.5 - 2.0 mmol/L   [place] Abdomen Complete  03/19/2015  CLINICAL DATA:  29 year old female with hematuria and low-grade  fever for the past 4 days with discolored urine for the past 2 days. Lower abdominal tenderness. EXAM: ULTRASOUND ABDOMEN COMPLETE COMPARISON:  None. FINDINGS: Gallbladder: No gallstones or wall thickening visualized. No sonographic Murphy sign noted. Common bile duct: Diameter: 2.1 mm in the porta hepatis. Liver: No focal lesion identified. Within normal limits in parenchymal echogenicity. IVC: No abnormality visualized. Pancreas: Visualized portion unremarkable. Spleen: Size and appearance within normal limits.  7.5 cm in length. Right Kidney: Length: 10.9 cm. Echogenicity within normal limits. No mass or hydronephrosis visualized. Left Kidney: Length: 11.7 cm. Echogenicity within normal limits. No mass or hydronephrosis visualized. Abdominal aorta: No aneurysm visualized. Other findings: None. IMPRESSION: 1. No acute findings. Specifically, no gallstones and no findings to suggest an acute cholecystitis at this time. Electronically Signed   By: Trudie Reedaniel  Entrikin M.D.   On: 03/19/2015 22:54   [place] Renal  03/19/2015  CLINICAL DATA:  Acute onset of  hematuria and low-grade fever. Lower abdominal tenderness. Initial encounter. EXAM: RENAL / URINARY TRACT ULTRASOUND COMPLETE COMPARISON:  None. FINDINGS: Right Kidney: Length: 10.4 cm. Echogenicity within normal limits. Incompletely rotated. No mass or hydronephrosis visualized. Left Kidney: Length: 11.2 cm. Echogenicity within normal limits. No mass or hydronephrosis visualized. Bladder: Appears normal for degree of bladder distention. Bilateral ureteral jets are visualized. IMPRESSION: No evidence of hydronephrosis.  Unremarkable renal ultrasound. Electronically Signed   By: Roanna Raider M.D.   On: 03/19/2015 03:48    Patient seen by me. Patient's labs show a market abnormalities in the liver function tests. With elevation of the enzymes alkaline phosphatase and total bili. No significant leukocytosis. Patient without any unusual travel or travel to any foreign  countries. Patient basically with complaint of fevers and headache abdominal pain predominantly epigastric right upper quadrant. Patient denies any vomiting or diarrhea.  Patient was seen yesterday had ultrasound of the kidneys because there was blood in the urine. That was negative today's urine is normal. Hepatitis panel is been sent and is pending. Ultrasound was done to rule out any gallbladder problems. No evidence of cholelithiasis. Gallbladder is normal. Patient remains tender in the right upper quadrant. Will do CT scan of abdomen if negative patient can be discharged home with follow-up with her primary care doctor and for final results of the hepatitis panel.     Vanetta Mulders, MD 03/19/15 [phone]

## 2015-03-19 NOTE — ED Notes (Signed)
Lab to add-on specimen

## 2015-03-20 ENCOUNTER — Encounter (HOSPITAL_COMMUNITY): Payer: Self-pay | Admitting: Radiology

## 2015-03-20 LAB — PREGNANCY, URINE: PREG TEST UR: NEGATIVE

## 2015-03-20 LAB — HEPATITIS PANEL, ACUTE
HEP B S AG: NEGATIVE
Hep A IgM: NEGATIVE
Hep B C IgM: NEGATIVE

## 2015-03-20 MED ORDER — IOHEXOL 300 MG/ML  SOLN
100.0000 mL | Freq: Once | INTRAMUSCULAR | Status: AC | PRN
  Administered 2015-03-20: 100 mL via INTRAVENOUS

## 2015-03-20 MED ORDER — ONDANSETRON HCL 4 MG PO TABS: 4.0000 mg | ORAL_TABLET | Freq: Four times a day (QID) | ORAL | Status: AC | PRN

## 2015-03-20 MED ORDER — TRAMADOL HCL 50 MG PO TABS: 50.0000 mg | ORAL_TABLET | Freq: Four times a day (QID) | ORAL | Status: AC | PRN

## 2015-03-20 NOTE — Discharge Instructions (Signed)
Your tests are showed evidence of hepatitis. Blood has been sent to tell which type of hepatitis you have. This results will be back for several days. Please see your primary care provider to discuss those results. Return if pain is getting worse, you start running a high fever, or if he starts vomiting. Take ibuprofen to control fever. Do not use acetaminophen because it can be harmful to the liver.  Tramadol tablets What is this medicine? TRAMADOL (TRA ma dole) is a pain reliever. It is used to treat moderate to severe pain in adults. This medicine may be used for other purposes; ask your health care provider or pharmacist if you have questions. What should I tell my health care provider before I take this medicine? They need to know if you have any of these conditions: -brain tumor -depression -drug abuse or addiction -head injury -if you frequently drink alcohol containing drinks -kidney disease or trouble passing urine -liver disease -lung disease, asthma, or breathing problems -seizures or epilepsy -suicidal thoughts, plans, or attempt; a previous suicide attempt by you or a family member -an unusual or allergic reaction to tramadol, codeine, other medicines, foods, dyes, or preservatives -pregnant or trying to get pregnant -breast-feeding How should I use this medicine? Take this medicine by mouth with a full glass of water. Follow the directions on the prescription label. If the medicine upsets your stomach, take it with food or milk. Do not take more medicine than you are told to take. Talk to your pediatrician regarding the use of this medicine in children. Special care may be needed. Overdosage: If you think you have taken too much of this medicine contact a poison control center or emergency room at once. NOTE: This medicine is only for you. Do not share this medicine with others. What if I miss a dose? If you miss a dose, take it as soon as you can. If it is almost time for  your next dose, take only that dose. Do not take double or extra doses. What may interact with this medicine? Do not take this medicine with any of the following medications: -MAOIs like Carbex, Eldepryl, Marplan, Nardil, and Parnate This medicine may also interact with the following medications: -alcohol or medicines that contain alcohol -antihistamines -benzodiazepines -bupropion -carbamazepine or oxcarbazepine -clozapine -cyclobenzaprine -digoxin -furazolidone -linezolid -medicines for depression, anxiety, or psychotic disturbances -medicines for migraine headache like almotriptan, eletriptan, frovatriptan, naratriptan, rizatriptan, sumatriptan, zolmitriptan -medicines for pain like pentazocine, buprenorphine, butorphanol, meperidine, nalbuphine, and propoxyphene -medicines for sleep -muscle relaxants -naltrexone -phenobarbital -phenothiazines like perphenazine, thioridazine, chlorpromazine, mesoridazine, fluphenazine, prochlorperazine, promazine, and trifluoperazine -procarbazine -warfarin This list may not describe all possible interactions. Give your health care provider a list of all the medicines, herbs, non-prescription drugs, or dietary supplements you use. Also tell them if you smoke, drink alcohol, or use illegal drugs. Some items may interact with your medicine. What should I watch for while using this medicine? Tell your doctor or health care professional if your pain does not go away, if it gets worse, or if you have new or a different type of pain. You may develop tolerance to the medicine. Tolerance means that you will need a higher dose of the medicine for pain relief. Tolerance is normal and is expected if you take this medicine for a long time. Do not suddenly stop taking your medicine because you may develop a severe reaction. Your body becomes used to the medicine. This does NOT mean you are addicted. Addiction is  a behavior related to getting and using a drug for a  non-medical reason. If you have pain, you have a medical reason to take pain medicine. Your doctor will tell you how much medicine to take. If your doctor wants you to stop the medicine, the dose will be slowly lowered over time to avoid any side effects. You may get drowsy or dizzy. Do not drive, use machinery, or do anything that needs mental alertness until you know how this medicine affects you. Do not stand or sit up quickly, especially if you are an older patient. This reduces the risk of dizzy or fainting spells. Alcohol can increase or decrease the effects of this medicine. Avoid alcoholic drinks. You may have constipation. Try to have a bowel movement at least every 2 to 3 days. If you do not have a bowel movement for 3 days, call your doctor or health care professional. Your mouth may get dry. Chewing sugarless gum or sucking hard candy, and drinking plenty of water may help. Contact your doctor if the problem does not go away or is severe. What side effects may I notice from receiving this medicine? Side effects that you should report to your doctor or health care professional as soon as possible: -allergic reactions like skin rash, itching or hives, swelling of the face, lips, or tongue -breathing difficulties, wheezing -confusion -itching -light headedness or fainting spells -redness, blistering, peeling or loosening of the skin, including inside the mouth -seizures Side effects that usually do not require medical attention (report to your doctor or health care professional if they continue or are bothersome): -constipation -dizziness -drowsiness -headache -nausea, vomiting This list may not describe all possible side effects. Call your doctor for medical advice about side effects. You may report side effects to FDA at 1-800-FDA-1088. Where should I keep my medicine? Keep out of the reach of children. This medicine may cause accidental overdose and death if it taken by other  adults, children, or pets. Mix any unused medicine with a substance like cat litter or coffee grounds. Then throw the medicine away in a sealed container like a sealed bag or a coffee can with a lid. Do not use the medicine after the expiration date. Store at room temperature between 15 and 30 degrees C (59 and 86 degrees F). NOTE: This sheet is a summary. It may not cover all possible information. If you have questions about this medicine, talk to your doctor, pharmacist, or health care provider.    2016, Elsevier/Gold Standard. (2013-07-09 15:42:09)  Ondansetron tablets What is this medicine? ONDANSETRON (on DAN se tron) is used to treat nausea and vomiting caused by chemotherapy. It is also used to prevent or treat nausea and vomiting after surgery. This medicine may be used for other purposes; ask your health care provider or pharmacist if you have questions. What should I tell my health care provider before I take this medicine? They need to know if you have any of these conditions: -heart disease -history of irregular heartbeat -liver disease -low levels of magnesium or potassium in the blood -an unusual or allergic reaction to ondansetron, granisetron, other medicines, foods, dyes, or preservatives -pregnant or trying to get pregnant -breast-feeding How should I use this medicine? Take this medicine by mouth with a glass of water. Follow the directions on your prescription label. Take your doses at regular intervals. Do not take your medicine more often than directed. Talk to your pediatrician regarding the use of this medicine in children.  Special care may be needed. Overdosage: If you think you have taken too much of this medicine contact a poison control center or emergency room at once. NOTE: This medicine is only for you. Do not share this medicine with others. What if I miss a dose? If you miss a dose, take it as soon as you can. If it is almost time for your next dose, take  only that dose. Do not take double or extra doses. What may interact with this medicine? Do not take this medicine with any of the following medications: -apomorphine -certain medicines for fungal infections like fluconazole, itraconazole, ketoconazole, posaconazole, voriconazole -cisapride -dofetilide -dronedarone -pimozide -thioridazine -ziprasidone This medicine may also interact with the following medications: -carbamazepine -certain medicines for depression, anxiety, or psychotic disturbances -fentanyl -linezolid -MAOIs like Carbex, Eldepryl, Marplan, Nardil, and Parnate -methylene blue (injected into a vein) -other medicines that prolong the QT interval (cause an abnormal heart rhythm) -phenytoin -rifampicin -tramadol This list may not describe all possible interactions. Give your health care provider a list of all the medicines, herbs, non-prescription drugs, or dietary supplements you use. Also tell them if you smoke, drink alcohol, or use illegal drugs. Some items may interact with your medicine. What should I watch for while using this medicine? Check with your doctor or health care professional right away if you have any sign of an allergic reaction. What side effects may I notice from receiving this medicine? Side effects that you should report to your doctor or health care professional as soon as possible: -allergic reactions like skin rash, itching or hives, swelling of the face, lips or tongue -breathing problems -confusion -dizziness -fast or irregular heartbeat -feeling faint or lightheaded, falls -fever and chills -loss of balance or coordination -seizures -sweating -swelling of the hands or feet -tightness in the chest -tremors -unusually weak or tired Side effects that usually do not require medical attention (report to your doctor or health care professional if they continue or are bothersome): -constipation or diarrhea -headache This list may not  describe all possible side effects. Call your doctor for medical advice about side effects. You may report side effects to FDA at 1-800-FDA-1088. Where should I keep my medicine? Keep out of the reach of children. Store between 2 and 30 degrees C (36 and 86 degrees F). Throw away any unused medicine after the expiration date. NOTE: This sheet is a summary. It may not cover all possible information. If you have questions about this medicine, talk to your doctor, pharmacist, or health care provider.    2016, Elsevier/Gold Standard. (2013-02-17 16:27:45)

## 2015-03-20 NOTE — ED Notes (Signed)
Patient is resting comfortably. 

## 2015-03-20 NOTE — ED Provider Notes (Signed)
Patient initially seen and evaluated by Dr. Gustavus Messing, sent for CT scan after labs showed elevated LFTs and ultrasound was unremarkable. CT scan shows thickened gallbladder wall without signs of cholecystitis. I have discussed this with the radiologist, and these are findings that can be seen in the setting of hepatitis. Patient is nontoxic in appearance and is tolerating oral fluids well. She is discharged with prescriptions for tramadol and ondansetron and is referred back to her PCP. Return precautions given.  Results for orders placed or performed during the hospital encounter of 03/19/15  Comprehensive metabolic panel  Result Value Ref Range   Sodium 133 (L) 135 - 145 mmol/L   Potassium 4.0 3.5 - 5.1 mmol/L   Chloride 98 (L) 101 - 111 mmol/L   CO2 27 22 - 32 mmol/L   Glucose, Bld 102 (H) 65 - 99 mg/dL   BUN 6 6 - 20 mg/dL   Creatinine, Ser 1.61 0.44 - 1.00 mg/dL   Calcium 8.9 8.9 - 09.6 mg/dL   Total Protein 6.4 (L) 6.5 - 8.1 g/dL   Albumin 3.4 (L) 3.5 - 5.0 g/dL   AST 045 (H) 15 - 41 U/L   ALT 271 (H) 14 - 54 U/L   Alkaline Phosphatase 215 (H) 38 - 126 U/L   Total Bilirubin 1.7 (H) 0.3 - 1.2 mg/dL   GFR calc non Af Amer >60 >60 mL/min   GFR calc Af Amer >60 >60 mL/min   Anion gap 8 5 - 15  Urinalysis, Routine w reflex microscopic (not at [facility])  Result Value Ref Range   Color, Urine AMBER (A) YELLOW   APPearance CLOUDY (A) CLEAR   Specific Gravity, Urine 1.012 1.005 - 1.030   pH 6.5 5.0 - 8.0   Glucose, UA NEGATIVE NEGATIVE mg/dL   Hgb urine dipstick NEGATIVE NEGATIVE   Bilirubin Urine SMALL (A) NEGATIVE   Ketones, ur NEGATIVE NEGATIVE mg/dL   Protein, ur NEGATIVE NEGATIVE mg/dL   Urobilinogen, UA 1.0 0.0 - 1.0 mg/dL   Nitrite NEGATIVE NEGATIVE   Leukocytes, UA NEGATIVE NEGATIVE  CBC  Result Value Ref Range   WBC 6.8 4.0 - 10.5 K/uL   RBC 4.60 3.87 - 5.11 MIL/uL   Hemoglobin 13.0 12.0 - 15.0 g/dL   HCT 40.9 81.1 - 91.4 %   MCV 84.8 78.0 - 100.0 fL   MCH 28.3 26.0 - 34.0  pg   MCHC 33.3 30.0 - 36.0 g/dL   RDW 78.2 95.6 - 21.3 %   Platelets 130 (L) 150 - 400 K/uL  Pregnancy, urine  Result Value Ref Range   Preg Test, Ur NEGATIVE NEGATIVE  Lipase, blood  Result Value Ref Range   Lipase 44 11 - 51 U/L  I-Stat CG4 Lactic Acid, ED  (not at [facility])  Result Value Ref Range   Lactic Acid, Venous 0.53 0.5 - 2.0 mmol/L  POC Urine Pregnancy, ED (do NOT order at [facility])  Result Value Ref Range   Preg Test, Ur NEGATIVE NEGATIVE   US Abdomen Complete  03/19/2015  CLINICAL DATA:  29 year old female with hematuria and low-grade fever for the past 4 days with discolored urine for the past 2 days. Lower abdominal tenderness. EXAM: ULTRASOUND ABDOMEN COMPLETE COMPARISON:  None. FINDINGS: Gallbladder: No gallstones or wall thickening visualized. No sonographic Murphy sign noted. Common bile duct: Diameter: 2.1 mm in the porta hepatis. Liver: No focal lesion identified. Within normal limits in parenchymal echogenicity. IVC: No abnormality visualized. Pancreas: Visualized portion unremarkable. Spleen: Size and appearance  within normal limits.  7.5 cm in length. Right Kidney: Length: 10.9 cm. Echogenicity within normal limits. No mass or hydronephrosis visualized. Left Kidney: Length: 11.7 cm. Echogenicity within normal limits. No mass or hydronephrosis visualized. Abdominal aorta: No aneurysm visualized. Other findings: None. IMPRESSION: 1. No acute findings. Specifically, no gallstones and no findings to suggest an acute cholecystitis at this time. Electronically Signed   By: Trudie Reedaniel  Entrikin M.D.   On: 03/19/2015 22:54   Ct Abdomen Pelvis W Contrast  03/20/2015  CLINICAL DATA:  29 year old female with fever, headache, abdominal and flank pain with hematuria. EXAM: CT ABDOMEN AND PELVIS WITH CONTRAST TECHNIQUE: Multidetector CT imaging of the abdomen and pelvis was performed using the standard protocol following bolus administration of intravenous contrast. CONTRAST:  100mL OMNIPAQUE  IOHEXOL 300 MG/ML  SOLN COMPARISON:  No priors. FINDINGS: Lower chest:  Unremarkable. Hepatobiliary: No cystic or solid hepatic lesions. No intra or extrahepatic biliary ductal dilatation. Gallbladder is largely decompressed, however, the gallbladder wall is profoundly thickened measuring up to 13 mm in thickness in the fundal region (in retrospect, the recent ultrasound examination underestimated the true wall thickness of the gallbladder, which measures up to 12 mm in thickness when looking back on image 32 of the ultrasound examination from 03/19/2015). No definite gallstones are identified. Pancreas: No pancreatic mass. No pancreatic ductal dilatation. No pancreatic or peripancreatic fluid or inflammatory changes. Spleen: Unremarkable. Adrenals/Urinary Tract: Normal appearance of the adrenal glands and kidneys bilaterally. No hydroureteronephrosis. Urinary bladder is normal in appearance. Stomach/Bowel: Normal appearance of the stomach. No pathologic dilatation of small bowel or colon. Normal appendix. Vascular/Lymphatic: No significant atherosclerotic disease, aneurysm or dissection identified in the abdominal or pelvic vasculature. No lymphadenopathy noted in the abdomen or pelvis. Reproductive: Uterus and ovaries are unremarkable in appearance. Other: Trace amount of free fluid in the cul-de-sac, presumably physiologic in this Paez female patient. No larger volume of ascites. No pneumoperitoneum. Musculoskeletal: There are no aggressive appearing lytic or blastic lesions noted in the visualized portions of the skeleton. IMPRESSION: 1. Profound thickening of the gallbladder. Gallbladder does not appear distended, and there are no definite calcified gallstones. This finding is of uncertain etiology and significance, but can be seen in the setting of hepatitis. 2. Trace amount of free fluid in the cul-de-sac, likely physiologic in this Liszewski female patient. 3. Normal appendix. Electronically Signed   By: Trudie Reedaniel   Entrikin M.D.   On: 03/20/2015 01:16   [place] Renal  03/19/2015  CLINICAL DATA:  Acute onset of hematuria and low-grade fever. Lower abdominal tenderness. Initial encounter. EXAM: RENAL / URINARY TRACT ULTRASOUND COMPLETE COMPARISON:  None. FINDINGS: Right Kidney: Length: 10.4 cm. Echogenicity within normal limits. Incompletely rotated. No mass or hydronephrosis visualized. Left Kidney: Length: 11.2 cm. Echogenicity within normal limits. No mass or hydronephrosis visualized. Bladder: Appears normal for degree of bladder distention. Bilateral ureteral jets are visualized. IMPRESSION: No evidence of hydronephrosis.  Unremarkable renal ultrasound. Electronically Signed   By: Roanna RaiderJeffery  Chang M.D.   On: 03/19/2015 03:48      Dione Boozeavid Bintou Lafata, MD 03/20/15 [phone]

## 2015-03-20 NOTE — ED Notes (Signed)
Pt taken for CT scan 

## 2015-03-21 LAB — URINE CULTURE: Culture: >=100000

## 2015-03-22 ENCOUNTER — Telehealth: Payer: Self-pay

## 2015-03-22 NOTE — Telephone Encounter (Signed)
Post ED Visit - Positive Culture Follow-up  Culture report reviewed by antimicrobial stewardship pharmacist:  []  Celedonio MiyamotoJeremy Frens, Pharm.D., BCPS []  Georgina PillionElizabeth Martin, Pharm.D., BCPS []  LacombeMinh Pham, [address]Ph[address]D., BCPS, AAHIVP []  Estella HuskMichelle Turner, Pharm.D., BCPS, AAHIVP []  Magnoliaristy Reyes, [address]Pharm[address] [x]  Gretel AcreNick Gazda Cassie Stewart, Pharm.D.  Positive urine culture  no further patient follow-up is required at this time per [facility] HospitalRose PA.  Ashley JacobsFesterman, Mackenzi Krogh C 03/22/2015, 9:34 AM

## 2015-03-22 NOTE — Progress Notes (Signed)
ED Antimicrobial Stewardship Positive Culture Follow Up   Linda Middleton is an 29 y.o. female [facility] with a chief complaint of  Chief Complaint  Patient presents with  . Fever    Recent Results (from the past 720 hour(s))  Urine culture     Status: None   Collection Time: 03/19/15  7:03 PM  Result Value Ref Range Status   Specimen Description URINE, CLEAN CATCH  Final   Special Requests NONE  Final   Culture   Final    >=100,000 COLONIES/mL LACTOBACILLUS SPECIES Standardized susceptibility testing for this organism is not available.    Report Status 03/21/2015 FINAL  Final   [x]  Patient discharged originally without antimicrobial agent and treatment is NOT indicated  ED Provider: Cheri FowlerKayla Rose, PA-C  29 y/o F with 6 day hx of fever and chills. Endorses hematuria two days prior. RUQ pain but no signs of dysuria, flank pain, diarrhea, etc. Elevated LFTs but ultrasound unremarkable. Tmax 100.6. UA clean but urine cx grew lactobacillus but likely contaminant. No treatment at this time. Per note, pt was referred to her PCP and return precautions were given.   Sandi CarneNick Haralambos Middleton, PharmD Pharmacy Resident Pager: [phone] 03/22/2015, 8:49 AM Infectious Diseases Pharmacist Phone# [phone]

## 2015-06-05 ENCOUNTER — Other Ambulatory Visit (HOSPITAL_COMMUNITY): Payer: Self-pay | Admitting: Gastroenterology

## 2015-06-05 DIAGNOSIS — K754 Autoimmune hepatitis: Secondary | ICD-10-CM

## 2015-06-05 MED FILL — azaTHIOprine 50 MG TABS: 50 | 30 days supply | Qty: 30 | Fill #0

## 2015-06-07 MED FILL — predniSONE 10 MG TABS: 10 | 30 days supply | Qty: 80 | Fill #0

## 2015-06-09 ENCOUNTER — Other Ambulatory Visit: Payer: Self-pay | Admitting: Physician Assistant

## 2015-06-09 ENCOUNTER — Other Ambulatory Visit: Payer: Self-pay | Admitting: Radiology

## 2015-06-12 ENCOUNTER — Encounter (HOSPITAL_COMMUNITY): Payer: Self-pay

## 2015-06-12 ENCOUNTER — Ambulatory Visit (HOSPITAL_COMMUNITY)
Admission: RE | Admit: 2015-06-12 | Discharge: 2015-06-12 | Disposition: A | Discharge status: Home / Self Care | Payer: 59 | Source: Ambulatory Visit | Attending: Gastroenterology | Admitting: Gastroenterology

## 2015-06-12 DIAGNOSIS — R748 Abnormal levels of other serum enzymes: Secondary | ICD-10-CM | POA: Diagnosis not present

## 2015-06-12 DIAGNOSIS — K760 Fatty (change of) liver, not elsewhere classified: Secondary | ICD-10-CM | POA: Diagnosis not present

## 2015-06-12 DIAGNOSIS — K759 Inflammatory liver disease, unspecified: Secondary | ICD-10-CM | POA: Diagnosis not present

## 2015-06-12 DIAGNOSIS — R7989 Other specified abnormal findings of blood chemistry: Secondary | ICD-10-CM | POA: Insufficient documentation

## 2015-06-12 DIAGNOSIS — K754 Autoimmune hepatitis: Secondary | ICD-10-CM | POA: Insufficient documentation

## 2015-06-12 LAB — CBC
HCT: 44.7 % (ref 36.0–46.0)
HEMOGLOBIN: 14.8 g/dL (ref 12.0–15.0)
MCH: 29 pg (ref 26.0–34.0)
MCHC: 33.1 g/dL (ref 30.0–36.0)
MCV: 87.6 fL (ref 78.0–100.0)
Platelets: 266 10*3/uL (ref 150–400)
RBC: 5.1 MIL/uL (ref 3.87–5.11)
RDW: 13.8 % (ref 11.5–15.5)
WBC: 8.8 10*3/uL (ref 4.0–10.5)

## 2015-06-12 LAB — APTT: aPTT: 33 seconds (ref 24–37)

## 2015-06-12 LAB — PROTIME-INR
INR: 1.1 (ref 0.00–1.49)
PROTHROMBIN TIME: 14.4 s (ref 11.6–15.2)

## 2015-06-12 MED ORDER — MIDAZOLAM HCL 2 MG/2ML IJ SOLN
INTRAMUSCULAR | Status: AC
  Filled 2015-06-12: qty 2

## 2015-06-12 MED ORDER — FENTANYL CITRATE (PF) 100 MCG/2ML IJ SOLN
INTRAMUSCULAR | Status: AC | PRN
  Administered 2015-06-12: 50 ug via INTRAVENOUS

## 2015-06-12 MED ORDER — LIDOCAINE HCL (PF) 1 % IJ SOLN
INTRAMUSCULAR | Status: AC
  Filled 2015-06-12: qty 10

## 2015-06-12 MED ORDER — SODIUM CHLORIDE 0.9 % IV SOLN: INTRAVENOUS | Status: DC

## 2015-06-12 MED ORDER — GELATIN ABSORBABLE 12-7 MM EX MISC
CUTANEOUS | Status: AC
  Filled 2015-06-12: qty 1

## 2015-06-12 MED ORDER — MIDAZOLAM HCL 2 MG/2ML IJ SOLN
INTRAMUSCULAR | Status: AC | PRN
  Administered 2015-06-12 (×2): 1 mg via INTRAVENOUS

## 2015-06-12 MED ORDER — FENTANYL CITRATE (PF) 100 MCG/2ML IJ SOLN
INTRAMUSCULAR | Status: AC
  Filled 2015-06-12: qty 2

## 2015-06-12 NOTE — Sedation Documentation (Signed)
Bandaid R flank  

## 2015-06-12 NOTE — Sedation Documentation (Signed)
Denies pain

## 2015-06-12 NOTE — H&P (Signed)
Chief Complaint: Patient was seen in consultation today for liver core biopsy at the request of Schooler,Vincent  Referring Physician(s): Schooler,Vincent  History of Present Illness: Linda Middleton is a 30 y.o. female   Developed abd pain and fever 02/2015 CT revealed thickened Gallbladder Work up revealed elevated Liver function tests and "gold" urine Responded well to Prednisone po Still at 15 mg daily Request for liver core biopsy per Dr Bosie ClosSchooler Intent to start new medication soon  Past Medical History  Diagnosis Date  . Attention deficit disorder (ADD) without hyperactivity     Past Surgical History  Procedure Laterality Date  . Tonsillectomy and adenoidectomy      Allergies: Latex  Medications: Prior to Admission medications   Medication Sig Start Date End Date Taking? Authorizing Provider  BIOTIN PO Take 1 tablet by mouth daily.   Yes Historical Provider, MD  Multiple Vitamin (MULTIVITAMIN WITH MINERALS) TABS tablet Take 1 tablet by mouth daily.   Yes Historical Provider, MD  predniSONE (DELTASONE) 10 MG tablet Take 15 mg by mouth daily with breakfast.   Yes Historical Provider, MD  ondansetron (ZOFRAN) 4 MG tablet Take 1 tablet (4 mg total) by mouth every 6 (six) hours as needed for nausea or vomiting. Patient not taking: Reported on 06/06/2015 03/20/15   Dione Boozeavid Glick, MD  traMADol (ULTRAM) 50 MG tablet Take 1 tablet (50 mg total) by mouth every 6 (six) hours as needed. Patient not taking: Reported on 06/06/2015 03/20/15   Dione Boozeavid Glick, MD     History reviewed. No pertinent family history.  Social History   Social History  . Marital Status: Single    Spouse Name: N/A  . Number of Children: N/A  . Years of Education: N/A   Social History Main Topics  . Smoking status: Never Smoker   . Smokeless tobacco: None  . Alcohol Use: 0.6 - 1.2 oz/week    1-2 Glasses of wine per week  . Drug Use: No  . Sexual Activity: Yes   Other Topics Concern  . None    Social History Narrative     Review of Systems: A 12 point ROS discussed and pertinent positives are indicated in the HPI above.  All other systems are negative.  Review of Systems  Constitutional: Negative for fever, activity change, appetite change and fatigue.  Gastrointestinal: Positive for abdominal pain. Negative for nausea, vomiting, diarrhea and abdominal distention.  Genitourinary: Negative for difficulty urinating.  Neurological: Negative for dizziness and weakness.  Psychiatric/Behavioral: Negative for behavioral problems and confusion.    Vital Signs: BP 100/69 mmHg  Pulse 78  Temp(Src) 98.4 F (36.9 C)  Resp 18  Ht 5\' 4"  (1.626 m)  Wt 127 lb (57.607 kg)  BMI 21.79 kg/m2  SpO2 100%  Physical Exam  Constitutional: She is oriented to person, place, and time.  Cardiovascular: Normal rate and regular rhythm.   No murmur heard. Pulmonary/Chest: Effort normal and breath sounds normal. She has no wheezes.  Abdominal: Soft. Bowel sounds are normal. There is no tenderness.  Musculoskeletal: Normal range of motion.  Neurological: She is alert and oriented to person, place, and time.  Skin: Skin is warm and dry.  Psychiatric: She has a normal mood and affect. Her behavior is normal. Judgment and thought content normal.  Nursing note and vitals reviewed.   Mallampati Score:  MD Evaluation Airway: WNL Heart: WNL Abdomen: WNL Chest/ Lungs: WNL ASA  Classification: 2 Mallampati/Airway Score: One  Imaging: No results found.  Labs:  CBC:  Recent Labs  03/19/15 0136 03/19/15 1917  WBC  --  6.8  HGB 11.6* 13.0  HCT 34.0* 39.0  PLT  --  130*    COAGS: No results for input(s): INR, APTT in the last 8760 hours.  BMP:  Recent Labs  03/19/15 0136 03/19/15 1917  NA 137 133*  K 3.6 4.0  CL 98* 98*  CO2  --  27  GLUCOSE 95 102*  BUN 7 6  CALCIUM  --  8.9  CREATININE 1.00 0.87  GFRNONAA  --  >60  GFRAA  --  >60    LIVER FUNCTION  TESTS:  Recent Labs  03/19/15 1917  BILITOT 1.7*  AST 257*  ALT 271*  ALKPHOS 215*  PROT 6.4*  ALBUMIN 3.4*    TUMOR MARKERS: No results for input(s): AFPTM, CEA, CA199, CHROMGRNA in the last 8760 hours.  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 All of the patient's questions were answered, patient is agreeable to proceed. Consent signed and in chart.   Thank you for this interesting consult.  I greatly enjoyed meeting Linda Middleton and look forward to participating in their care.  A copy of this report was sent to the requesting provider on this date.  Electronically Signed: Ralene Muskrat A 06/12/2015, 12:17 PM   I spent a total of  30 Minutes   in face to face in clinical consultation, greater than 50% of which was counseling/coordinating care for liver core biopsy

## 2015-06-12 NOTE — Procedures (Signed)
Successful RT LIVER RANDOM CORE BX No comp Stable Path pending Full report in PACS ebl 0

## 2015-06-12 NOTE — Sedation Documentation (Signed)
No pian

## 2015-06-12 NOTE — Discharge Instructions (Signed)

## 2015-07-14 DIAGNOSIS — K754 Autoimmune hepatitis: Secondary | ICD-10-CM | POA: Diagnosis not present

## 2015-08-03 DIAGNOSIS — K754 Autoimmune hepatitis: Secondary | ICD-10-CM | POA: Diagnosis not present

## 2015-08-07 MED FILL — azaTHIOprine 50 MG TABS: 50 | 30 days supply | Qty: 30 | Fill #1

## 2015-08-25 ENCOUNTER — Emergency Department (HOSPITAL_COMMUNITY)
Admission: EM | Admit: 2015-08-25 | Discharge: 2015-08-26 | Disposition: A | Discharge status: Home / Self Care | Payer: 59 | Attending: Emergency Medicine | Admitting: Emergency Medicine

## 2015-08-25 ENCOUNTER — Emergency Department (HOSPITAL_COMMUNITY): Payer: 59

## 2015-08-25 ENCOUNTER — Encounter (HOSPITAL_COMMUNITY): Payer: Self-pay | Admitting: Emergency Medicine

## 2015-08-25 DIAGNOSIS — Z7951 Long term (current) use of inhaled steroids: Secondary | ICD-10-CM | POA: Diagnosis not present

## 2015-08-25 DIAGNOSIS — R209 Unspecified disturbances of skin sensation: Secondary | ICD-10-CM

## 2015-08-25 DIAGNOSIS — R202 Paresthesia of skin: Secondary | ICD-10-CM

## 2015-08-25 DIAGNOSIS — Z79899 Other long term (current) drug therapy: Secondary | ICD-10-CM | POA: Diagnosis not present

## 2015-08-25 DIAGNOSIS — R200 Anesthesia of skin: Secondary | ICD-10-CM | POA: Insufficient documentation

## 2015-08-25 DIAGNOSIS — Z91040 Latex allergy status: Secondary | ICD-10-CM | POA: Diagnosis not present

## 2015-08-25 DIAGNOSIS — Z8619 Personal history of other infectious and parasitic diseases: Secondary | ICD-10-CM | POA: Diagnosis not present

## 2015-08-25 DIAGNOSIS — IMO0001 Reserved for inherently not codable concepts without codable children: Secondary | ICD-10-CM

## 2015-08-25 DIAGNOSIS — R42 Dizziness and giddiness: Secondary | ICD-10-CM | POA: Diagnosis not present

## 2015-08-25 HISTORY — DX: Inflammatory liver disease, unspecified: K75.9

## 2015-08-25 LAB — COMPREHENSIVE METABOLIC PANEL
ALT: 29 U/L (ref 14–54)
AST: 28 U/L (ref 15–41)
Albumin: 4.2 g/dL (ref 3.5–5.0)
Alkaline Phosphatase: 38 U/L (ref 38–126)
Anion gap: 9 (ref 5–15)
BILIRUBIN TOTAL: 0.7 mg/dL (ref 0.3–1.2)
BUN: 14 mg/dL (ref 6–20)
CO2: 25 mmol/L (ref 22–32)
CREATININE: 0.98 mg/dL (ref 0.44–1.00)
Calcium: 9.1 mg/dL (ref 8.9–10.3)
Chloride: 104 mmol/L (ref 101–111)
GFR calc Af Amer: >60 mL/min (ref >60)
Glucose, Bld: 87 mg/dL (ref 65–99)
Potassium: 4.1 mmol/L (ref 3.5–5.1)
Sodium: 138 mmol/L (ref 135–145)
TOTAL PROTEIN: 6.6 g/dL (ref 6.5–8.1)

## 2015-08-25 LAB — I-STAT CHEM 8, ED
BUN: 17 mg/dL (ref 6–20)
CALCIUM ION: 1.11 mmol/L — AB (ref 1.12–1.23)
Chloride: 100 mmol/L — ABNORMAL LOW (ref 101–111)
Creatinine, Ser: 1 mg/dL (ref 0.44–1.00)
GLUCOSE: 83 mg/dL (ref 65–99)
HCT: 42 % (ref 36.0–46.0)
Hemoglobin: 14.3 g/dL (ref 12.0–15.0)
Potassium: 3.9 mmol/L (ref 3.5–5.1)
Sodium: 139 mmol/L (ref 135–145)
TCO2: 27 mmol/L (ref 0–100)

## 2015-08-25 LAB — DIFFERENTIAL
BASOS ABS: 0 10*3/uL (ref 0.0–0.1)
Basophils Relative: 0 %
EOS ABS: 0.4 10*3/uL (ref 0.0–0.7)
Eosinophils Relative: 4 %
LYMPHS ABS: 2.8 10*3/uL (ref 0.7–4.0)
Lymphocytes Relative: 31 %
MONOS PCT: 8 %
Monocytes Absolute: 0.8 10*3/uL (ref 0.1–1.0)
NEUTROS ABS: 5.1 10*3/uL (ref 1.7–7.7)
Neutrophils Relative %: 57 %

## 2015-08-25 LAB — CBC
HEMATOCRIT: 40.9 % (ref 36.0–46.0)
HEMOGLOBIN: 13.3 g/dL (ref 12.0–15.0)
MCH: 28.4 pg (ref 26.0–34.0)
MCHC: 32.5 g/dL (ref 30.0–36.0)
MCV: 87.2 fL (ref 78.0–100.0)
Platelets: 298 10*3/uL (ref 150–400)
RBC: 4.69 MIL/uL (ref 3.87–5.11)
RDW: 13.2 % (ref 11.5–15.5)
WBC: 9.1 10*3/uL (ref 4.0–10.5)

## 2015-08-25 LAB — I-STAT TROPONIN, ED: TROPONIN I, POC: 0 ng/mL (ref 0.00–0.08)

## 2015-08-25 LAB — CBG MONITORING, ED: GLUCOSE-CAPILLARY: 86 mg/dL (ref 65–99)

## 2015-08-25 LAB — PROTIME-INR
INR: 1.15 (ref 0.00–1.49)
Prothrombin Time: 14.9 seconds (ref 11.6–15.2)

## 2015-08-25 LAB — APTT: APTT: 35 s (ref 24–37)

## 2015-08-25 NOTE — ED Notes (Signed)
Neurology at bedside.

## 2015-08-25 NOTE — ED Notes (Addendum)
Pt. reports sudden onset ( 8:20 PM ) right distal thigh pain with tingling/numbness , right arm and right facial numbness with mild dizziness and lightheadedness - Code Stroke called by triage RN . Seen by EDP and Neurologist prior to transport to CT scan rm.3 .

## 2015-08-25 NOTE — ED Notes (Signed)
CALLED TO ACTIVATE CODE STROKE 

## 2015-08-25 NOTE — Consult Note (Signed)
Neurology Consultation Reason for Consult: Tingling Referring Physician: Anitra LauthPlunkett, W  CC: Tingling  History is obtained from: Patient  HPI: Linda Middleton is a 30 y.o. female with history of autoimmune hepatitis who presents with numbness and tingling of her right side. She states that it started in her right thigh and then gradually worked his way up involving her arm and then finally her face. At the time of my interview having gone for approximately 1 hour, was rapidly improving. She denies associated weakness. She denies previous episodes similar to this. She denies associated headache. She denies photophobia.   LKW: 20:20 tpa given?: no, mild symptoms Premorbid modified rankin scale: 0    ROS: A 14 point ROS was performed and is negative except as noted in the HPI.   Past Medical History  Diagnosis Date  . Attention deficit disorder (ADD) without hyperactivity   . Hepatitis     FHx: No history of stroke  Social History:  reports that she has never smoked. She does not have any smokeless tobacco history on file. She reports that she drinks alcohol. She reports that she does not use illicit drugs.   Exam: Current vital signs: BP 121/56 mmHg  Pulse 78  Temp(Src) 98.4 F (36.9 C) (Oral)  Resp 21  Ht 5\' 4"  (1.626 m)  Wt 58.968 kg (130 lb)  BMI 22.30 kg/m2  SpO2 100%  LMP 08/24/2015 Vital signs in last 24 hours: Temp:  [98.4 F (36.9 C)] 98.4 F (36.9 C) (03/31 2103) Pulse Rate:  [72-78] 78 (03/31 2115) Resp:  [16-21] 21 (03/31 2115) BP: (108-121)/(56-62) 121/56 mmHg (03/31 2115) SpO2:  [98 %-100 %] 100 % (03/31 2115) Weight:  [58.968 kg (130 lb)] 58.968 kg (130 lb) (03/31 2103)   Physical Exam  Constitutional: Appears well-developed and well-nourished.  Psych: Affect appropriate to situation Eyes: No scleral injection HENT: No OP obstrucion Head: Normocephalic.  Cardiovascular: Normal rate and regular rhythm.  Respiratory: Effort normal and breath sounds  normal to anterior ascultation GI: Soft.  No distension. There is no tenderness.  Skin: WDI  Neuro: Mental Status: Patient is awake, alert, oriented to person, place, month, year, and situation. Patient is able to give a clear and coherent history. No signs of aphasia or neglect Cranial Nerves: II: Visual Fields are full. Pupils are equal, round, and reactive to light.   III,IV, VI: EOMI without ptosis or diploplia.  V: Facial sensation is mildly diminished to pinprick on right VII: Facial movement is symmetric.  VIII: hearing is intact to voice X: Uvula elevates symmetrically XI: Shoulder shrug is symmetric. XII: tongue is midline without atrophy or fasciculations.  Motor: Tone is normal. Bulk is normal. 5/5 strength was present in all four extremities.  Sensory: Sensation is mildly diminished to pinprick on right Cerebellar: FNF and HKS are intact bilaterally   I have reviewed labs in epic and the results pertinent to this consultation are: CMP-unremarkable  I have reviewed the images obtained: CT head-unremarkable  Impression: 30 year old female with right-sided paresthesia. The fact that are positive symptoms (paresthesia) as well as migratory nature starting in the thigh and moving towards the face are more suggestive of migraine type aura than ischemic symptoms. She does not have any significant risk factors for stroke, but given her autoimmune disease that he think that an MRI would be prudent. If this is negative, then I do not think I would pursue further workup at this time, however if recurrent symptoms happen in the future or other  neurological phenomena then further workup may be required at that time.  Recommendations: 1) MRI brain 2) if this is negative and her symptoms are improving, then a do not think I would pursue any further workup.   Ritta Slot, MD [facility] [phone]  If 7pm- 7am, please page neurology on call as listed in  [facility].

## 2015-08-25 NOTE — Discharge Instructions (Signed)
Paresthesia  Paresthesia is a burning or prickling feeling. This feeling can happen in any part of the body. It often happens in the hands, arms, legs, or feet. Usually, it is not painful. In most cases, the feeling goes away in a short time and is not a sign of a serious problem.  HOME CARE  · Avoid drinking alcohol.  · Try massage or needle therapy (acupuncture) to help with your problems.  · Keep all follow-up visits as told by your doctor. This is important.  GET HELP IF:  · You keep on having episodes of paresthesia.  · Your burning or prickling feeling gets worse when you walk.  · You have pain or cramps.  · You feel dizzy.  · You have a rash.  GET HELP RIGHT AWAY IF:  · You feel weak.  · You have trouble walking or moving.  · You have problems speaking, understanding, or seeing.  · You feel confused.  · You cannot control when you pee (urinate) or poop (bowel movement).  · You lose feeling (numbness) after an injury.  · You pass out (faint).     This information is not intended to replace advice given to you by your health care provider. Make sure you discuss any questions you have with your health care provider.     Document Released: 04/25/2008 Document Revised: 09/27/2014 Document Reviewed: 05/09/2014  Elsevier Interactive Patient Education ©2016 [employer]

## 2015-08-25 NOTE — ED Notes (Signed)
Pt ambulatory to lobby desk, reports she was at work, had onset of pain just above the right knee, then had a numbness sensation in her right leg that moved up to her right arm, right facial.

## 2015-08-25 NOTE — ED Provider Notes (Addendum)
CSN: [id number]     Arrival date & time 08/25/15  2039 History   First MD Initiated Contact with Patient 08/25/15 2102     Chief Complaint  Patient presents with  . Numbness     (Consider location/radiation/quality/duration/timing/severity/associated sxs/prior Treatment) HPI Comments: Patient is a 30 year old female with a history of autoimmune hepatitis on azithroprine who works at [employer] and was at her job when she suddenly developed tingling and numbness in her right thigh at 2020. It is been removed to include her right arm and right face. A coworker thought she may have even had right-sided facial droop. Patient denied any speech, swallowing or visual changes. She did feel slightly dizzy. Currently his symptoms are improved but not resolved. She denies any recent illness or medication changes. No history of prior similar symptoms.  No chest pain, neck pain, headache, shortness of breath, abdominal pain, nausea or vomiting.  Patient is a 30 y.o. female presenting with neurologic complaint. The history is provided by the patient.  Neurologic Problem This is a new problem. The current episode started less than 1 hour ago. The problem occurs constantly. The problem has been gradually improving. Pertinent negatives include no headaches. Nothing aggravates the symptoms. Nothing relieves the symptoms. She has tried nothing for the symptoms.    Past Medical History  Diagnosis Date  . Attention deficit disorder (ADD) without hyperactivity   . Hepatitis    Past Surgical History  Procedure Laterality Date  . Tonsillectomy and adenoidectomy    . Liver biopsy     No family history on file. Social History  Substance Use Topics  . Smoking status: Never Smoker   . Smokeless tobacco: None  . Alcohol Use: Yes   OB History    No data available     Review of Systems  Neurological: Negative for headaches.  All other systems reviewed and are negative.     Allergies  Latex  Home  Medications   Prior to Admission medications   Medication Sig Start Date End Date Taking? Authorizing Provider  BIOTIN PO Take 1 tablet by mouth daily.    Historical Provider, MD  Multiple Vitamin (MULTIVITAMIN WITH MINERALS) TABS tablet Take 1 tablet by mouth daily.    Historical Provider, MD  ondansetron (ZOFRAN) 4 MG tablet Take 1 tablet (4 mg total) by mouth every 6 (six) hours as needed for nausea or vomiting. Patient not taking: Reported on 06/06/2015 03/20/15   Dione Booze, MD  predniSONE (DELTASONE) 10 MG tablet Take 15 mg by mouth daily with breakfast.    Historical Provider, MD  traMADol (ULTRAM) 50 MG tablet Take 1 tablet (50 mg total) by mouth every 6 (six) hours as needed. Patient not taking: Reported on 06/06/2015 03/20/15   Dione Booze, MD   BP 108/62 mmHg  Pulse 72  Temp(Src) 98.4 F (36.9 C) (Oral)  Resp 16  Ht  (1.626 m)  Wt 130 lb (58.968 kg)  BMI 22.30 kg/m2  SpO2 98%  LMP 08/24/2015 Physical Exam  Constitutional: She is oriented to person, place, and time. She appears well-developed and well-nourished. No distress.  HENT:  Head: Normocephalic and atraumatic.  Mouth/Throat: Oropharynx is clear and moist.  Eyes: Conjunctivae and EOM are normal. Pupils are equal, round, and reactive to light.  Neck: Normal range of motion. Neck supple.  Cardiovascular: Normal rate, regular rhythm and intact distal pulses.   No murmur heard. Pulmonary/Chest: Effort normal and breath sounds normal. No respiratory distress. She has no  wheezes. She has no rales.  Abdominal: Soft. She exhibits no distension. There is no tenderness. There is no rebound and no guarding.  Musculoskeletal: Normal range of motion. She exhibits no edema or tenderness.  Neurological: She is alert and oriented to person, place, and time. A sensory deficit is present.  Decreased sensation to sharp touch in the right face, upper and lower extremity  Skin: Skin is warm and dry. No rash noted. No erythema.   Psychiatric: She has a normal mood and affect. Her behavior is normal.  Nursing note and vitals reviewed.   ED Course  Procedures (including critical care time) Labs Review Labs Reviewed  I-STAT CHEM 8, ED - Abnormal; Notable for the following:    Chloride 100 (*)    Calcium, Ion 1.11 (*)    All other components within normal limits  PROTIME-INR  APTT  CBC  DIFFERENTIAL  COMPREHENSIVE METABOLIC PANEL  I-STAT TROPOININ, ED  CBG MONITORING, ED    Imaging Review Ct Head Wo Contrast  08/25/2015  CLINICAL DATA:  Right-sided numbness and tingling. EXAM: CT HEAD WITHOUT CONTRAST TECHNIQUE: Contiguous axial images were obtained from the base of the skull through the vertex without intravenous contrast. COMPARISON:  None. FINDINGS: Sinuses/Soft tissues: Clear paranasal sinuses and mastoid air cells. Intracranial: No mass lesion, hemorrhage, hydrocephalus, acute infarct, intra-axial, or extra-axial fluid collection. IMPRESSION: Normal head CT. Critical test results telephoned to . Dr. Amada Jupiter. at the time of interpretation at . 9:19 p.m.on . 08/25/2015. Electronically Signed   By: Jeronimo Greaves M.D.   On: 08/25/2015 21:19   Mr Brain Wo Contrast  08/25/2015  CLINICAL DATA:  Acute onset RIGHT-sided numbness and tingling. History of hepatitis. EXAM: MRI HEAD WITHOUT CONTRAST TECHNIQUE: Multiplanar, multiecho pulse sequences of the brain and surrounding structures were obtained without intravenous contrast. COMPARISON:  CT head August 25, 2015 at 2108 hours FINDINGS: The ventricles and sulci are normal for patient's age. No abnormal parenchymal signal, mass lesions, mass effect. No abnormal parenchymal enhancement. No reduced diffusion to suggest acute ischemia. No susceptibility artifact to suggest hemorrhage. No abnormal extra-axial fluid collections. No extra-axial masses nor leptomeningeal enhancement. Normal major intracranial vascular flow voids seen at the skull base. Mildly tortuous LEFT  vertebral artery deforms the LEFT ventral medulla. Ocular globes and orbital contents are unremarkable though not tailored for evaluation. No suspicious calvarial bone marrow signal. No abnormal sellar expansion. Craniocervical junction maintained. Visualized paranasal sinuses and mastoid air cells are well-aerated. IMPRESSION: Normal noncontrast MRI head. Electronically Signed   By: Awilda Metro M.D.   On: 08/25/2015 22:36   I have personally reviewed and evaluated these images and lab results as part of my medical decision-making.  ED ECG REPORT   Date: 08/25/2015  Rate: 77  Rhythm: normal sinus rhythm  QRS Axis: normal  Intervals: normal  ST/T Wave abnormalities: nonspecific T wave changes  Conduction Disutrbances:none  Narrative Interpretation:   Old EKG Reviewed: unchanged  I have personally reviewed the EKG tracing and agree with the computerized printout as noted.   MDM   Final diagnoses:  Paresthesias/numbness    Patient is a 30 year old female with a history of autoimmune hepatitis presenting today as a code stroke. Patient at 2020 developed right-sided numbness and tingling with concern for possible facial droop. 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 Patient is currently still having right-sided numbness however that is her only deficit and at this time she does  not meet criteria for TPA. She currently denies a headache. No recent infectious symptoms or headaches. NIH scale of 1.  Patient has no prior history of similar symptoms. Neurology is requesting MRI for further evaluation. Currently hemodynamically stable.  11:07 PM MRI wnl.  On re-eval pt's sx are almost completely resolved. Pt will be d/ced home to f/u with neurology  Gwyneth SproutWhitney Skyah Hannon, MD 08/25/15 [phone]  Gwyneth SproutWhitney Jaqua Ching, MD 08/25/15 [phone]

## 2015-08-25 NOTE — Progress Notes (Signed)
Code Stroke called on 30 y.o female. Per Patient she was at work and developed acute right thigh pain and numbness/tingling which gradually moved up to her arm and face. LSN 2020. Pertinent history includes autoimmune hepatitis. Taken to CT scan STAT, negative per Neurologist Dr. Amada JupiterKirkpatrick. NIHSS completed yielding a score of 1 for mild sensory deficit in right leg and arm. Glucose 83.  Pt for MRI tonight. Not a TPA candidate at this time as her symptoms are mild and improving.

## 2015-08-25 NOTE — ED Notes (Signed)
EDP at bedside  

## 2015-09-04 MED FILL — azaTHIOprine 50 MG TABS: 50 | 30 days supply | Qty: 30 | Fill #0

## 2015-09-07 DIAGNOSIS — K754 Autoimmune hepatitis: Secondary | ICD-10-CM | POA: Diagnosis not present

## 2015-10-16 DIAGNOSIS — K754 Autoimmune hepatitis: Secondary | ICD-10-CM | POA: Diagnosis not present

## 2015-10-31 DIAGNOSIS — K754 Autoimmune hepatitis: Secondary | ICD-10-CM | POA: Diagnosis not present

## 2015-10-31 DIAGNOSIS — K759 Inflammatory liver disease, unspecified: Secondary | ICD-10-CM | POA: Diagnosis not present

## 2015-11-16 DIAGNOSIS — K759 Inflammatory liver disease, unspecified: Secondary | ICD-10-CM | POA: Diagnosis not present

## 2015-12-27 DIAGNOSIS — K754 Autoimmune hepatitis: Secondary | ICD-10-CM | POA: Diagnosis not present

## 2015-12-29 DIAGNOSIS — R945 Abnormal results of liver function studies: Secondary | ICD-10-CM | POA: Diagnosis not present

## 2016-01-12 DIAGNOSIS — H5213 Myopia, bilateral: Secondary | ICD-10-CM | POA: Diagnosis not present

## 2016-01-12 DIAGNOSIS — H52223 Regular astigmatism, bilateral: Secondary | ICD-10-CM | POA: Diagnosis not present

## 2016-03-30 ENCOUNTER — Encounter (HOSPITAL_COMMUNITY): Payer: Self-pay | Admitting: Emergency Medicine

## 2016-03-30 ENCOUNTER — Ambulatory Visit (HOSPITAL_COMMUNITY)
Admission: EM | Admit: 2016-03-30 | Discharge: 2016-03-30 | Disposition: A | Discharge status: Home / Self Care | Payer: 59 | Attending: Emergency Medicine | Admitting: Emergency Medicine

## 2016-03-30 DIAGNOSIS — J209 Acute bronchitis, unspecified: Secondary | ICD-10-CM | POA: Diagnosis not present

## 2016-03-30 MED ORDER — PREDNISONE 50 MG PO TABS: ORAL_TABLET | ORAL | 0 refills | Status: AC

## 2016-03-30 MED ORDER — HYDROCOD POLST-CPM POLST ER 10-8 MG/5ML PO SUER: 5.0000 mL | Freq: Two times a day (BID) | ORAL | 0 refills | Status: AC | PRN

## 2016-03-30 MED ORDER — AZITHROMYCIN 250 MG PO TABS: ORAL_TABLET | ORAL | 0 refills | Status: AC

## 2016-03-30 NOTE — ED Triage Notes (Signed)
Cough for a week and no improvement.  Low fever, post nasal drip, ears pop with swallowing, intermittent light headedness.  Symptoms seemed to have worsen over the past 24 hours.  Last night cough and burning in chest disrupted sleep.

## 2016-03-30 NOTE — Discharge Instructions (Signed)
You have bronchitis. °Take azithromycin and prednisone as prescribed. °Use tussionex as needed for cough. °You should see improvement in the next 3-5 days. °If you develop fevers, difficulty breathing, or are just not getting better, please come back or go to the emergency room. ° °

## 2016-03-30 NOTE — ED Provider Notes (Signed)
[facility]    CSN: [id number] Arrival date & time: 03/30/16  1932     History   Chief Complaint Chief Complaint  Patient presents with  . Cough    HPI Linda Middleton is a 30 y.o. female.   HPI  She is a 30 year old woman here for evaluation of cough. She states for the last week she has had URI symptoms including congestion, postnasal drainage, dry cough, ear popping, and low-grade fevers. In the last 24 hours her symptoms have gotten worse. She denies any shortness of breath. She does report some intermittent wheezing. She has been taking TheraFlu and NyQuil without much improvement.  Past Medical History:  Diagnosis Date  . Attention deficit disorder (ADD) without hyperactivity   . Hepatitis     Patient Active Problem List   Diagnosis Date Noted  . Autoimmune hepatitis [facility](HCC)     Past Surgical History:  Procedure Laterality Date  . LIVER BIOPSY    . TONSILLECTOMY AND ADENOIDECTOMY      OB History    No data available       Home Medications    Prior to Admission medications   Medication Sig Start Date End Date Taking? Authorizing Provider  DiphenhydrAMINE HCl, Sleep, (ZZZQUIL PO) Take by mouth.   Yes Historical Provider, MD  azaTHIOprine (IMURAN) 50 MG tablet Take 50 mg by mouth daily.    Historical Provider, MD  azithromycin (ZITHROMAX Z-PAK) 250 MG tablet Take 2 pills today, then 1 pill daily until gone. 03/30/16   Charm RingsErin J Honig, MD  BIOTIN PO Take 1 tablet by mouth daily.    Historical Provider, MD  chlorpheniramine-HYDROcodone (TUSSIONEX PENNKINETIC ER) 10-8 MG/5ML SUER Take 5 mLs by mouth every 12 (twelve) hours as needed for cough. 03/30/16   Charm RingsErin J Honig, MD  Multiple Vitamin (MULTIVITAMIN WITH MINERALS) TABS tablet Take 1 tablet by mouth daily.    Historical Provider, MD  ondansetron (ZOFRAN) 4 MG tablet Take 1 tablet (4 mg total) by mouth every 6 (six) hours as needed for nausea or vomiting. Patient not taking: Reported on 06/06/2015 03/20/15    Dione Boozeavid Glick, MD  predniSONE (DELTASONE) 50 MG tablet Take 1 pill daily for 5 days. 03/30/16   Charm RingsErin J Honig, MD  traMADol (ULTRAM) 50 MG tablet Take 1 tablet (50 mg total) by mouth every 6 (six) hours as needed. Patient not taking: Reported on 06/06/2015 03/20/15   Dione Boozeavid Glick, MD    Family History No family history on file.  Social History Social History  Substance Use Topics  . Smoking status: Never Smoker  . Smokeless tobacco: Not on file  . Alcohol use Yes     Allergies   Latex   Review of Systems Review of Systems As in history of present illness  Physical Exam Triage Vital Signs ED Triage Vitals [03/30/16 2011]  Enc Vitals Group     BP      Pulse      Resp      Temp      Temp src      SpO2      Weight      Height      Head Circumference      Peak Flow      Pain Score 0     Pain Loc      Pain Edu?      Excl. in GC?    No data found.   Updated Vital Signs BP 105/69 (BP Location: Left  Arm)   Pulse 76   Temp 99.6 F (37.6 C) (Oral)   Resp 16   LMP 03/09/2016   SpO2 97%   Visual Acuity Right Eye Distance:   Left Eye Distance:   Bilateral Distance:    Right Eye Near:   Left Eye Near:    Bilateral Near:     Physical Exam  Constitutional: She appears well-developed and well-nourished. No distress.  HENT:  Mouth/Throat: No oropharyngeal exudate.  Right TM with clear effusion. Left TM is retracted. There is a mild amount of clear postnasal drainage and mild oropharyngeal erythema.  Cardiovascular: Normal rate, regular rhythm and normal heart sounds.   No murmur heard. Pulmonary/Chest: Effort normal and breath sounds normal. She has no wheezes. She has no rales.  Wheeze with cough  Lymphadenopathy:    She has no cervical adenopathy.     UC Treatments / Results  Labs (all labs ordered are listed, but only abnormal results are displayed) Labs Reviewed - No data to display  EKG  EKG Interpretation None       Radiology No results  found.  Procedures Procedures (including critical care time)  Medications Ordered in UC Medications - No data to display   Initial Impression / Assessment and Plan / UC Course  I have reviewed the triage vital signs and the nursing notes.  Pertinent labs & imaging results that were available during my care of the patient were reviewed by me and considered in my medical decision making (see chart for details).  Clinical Course    Treat with prednisone and azithromycin. Tussionex for cough. Follow-up as needed.  Final Clinical Impressions(s) / UC Diagnoses   Final diagnoses:  Acute bronchitis, unspecified organism    New Prescriptions Discharge Medication List as of 03/30/2016  8:22 PM    START taking these medications   Details  azithromycin (ZITHROMAX Z-PAK) 250 MG tablet Take 2 pills today, then 1 pill daily until gone., Normal    chlorpheniramine-HYDROcodone (TUSSIONEX PENNKINETIC ER) 10-8 MG/5ML SUER Take 5 mLs by mouth every 12 (twelve) hours as needed for cough., Starting Sat 03/30/2016, Print    predniSONE (DELTASONE) 50 MG tablet Take 1 pill daily for 5 days., Normal         Charm RingsErin J Honig, MD 03/30/16 2037

## 2016-04-12 DIAGNOSIS — K754 Autoimmune hepatitis: Secondary | ICD-10-CM | POA: Diagnosis not present

## 2016-06-19 DIAGNOSIS — Z1151 Encounter for screening for human papillomavirus (HPV): Secondary | ICD-10-CM | POA: Diagnosis not present

## 2016-06-19 DIAGNOSIS — R87612 Low grade squamous intraepithelial lesion on cytologic smear of cervix (LGSIL): Secondary | ICD-10-CM | POA: Diagnosis not present

## 2016-06-19 DIAGNOSIS — Z01419 Encounter for gynecological examination (general) (routine) without abnormal findings: Secondary | ICD-10-CM | POA: Diagnosis not present

## 2016-06-19 DIAGNOSIS — Z6821 Body mass index (BMI) 21.0-21.9, adult: Secondary | ICD-10-CM | POA: Diagnosis not present

## 2016-06-19 DIAGNOSIS — Z3200 Encounter for pregnancy test, result unknown: Secondary | ICD-10-CM | POA: Diagnosis not present

## 2016-06-19 DIAGNOSIS — B977 Papillomavirus as the cause of diseases classified elsewhere: Secondary | ICD-10-CM | POA: Diagnosis not present

## 2016-06-19 MED FILL — MONO-LINYAH 28 TABLET: 0.25-35 | 84 days supply | Qty: 84 | Fill #0

## 2016-07-03 DIAGNOSIS — R87810 Cervical high risk human papillomavirus (HPV) DNA test positive: Secondary | ICD-10-CM | POA: Diagnosis not present

## 2016-07-03 DIAGNOSIS — R87612 Low grade squamous intraepithelial lesion on cytologic smear of cervix (LGSIL): Secondary | ICD-10-CM | POA: Diagnosis not present

## 2016-07-03 DIAGNOSIS — B977 Papillomavirus as the cause of diseases classified elsewhere: Secondary | ICD-10-CM | POA: Diagnosis not present

## 2016-07-03 MED FILL — FOLIC ACID 1 MG TABLET: 1 | 30 days supply | Qty: 90 | Fill #0

## 2016-07-22 DIAGNOSIS — R87612 Low grade squamous intraepithelial lesion on cytologic smear of cervix (LGSIL): Secondary | ICD-10-CM | POA: Diagnosis not present

## 2016-07-22 DIAGNOSIS — N888 Other specified noninflammatory disorders of cervix uteri: Secondary | ICD-10-CM | POA: Diagnosis not present

## 2016-07-22 DIAGNOSIS — B977 Papillomavirus as the cause of diseases classified elsewhere: Secondary | ICD-10-CM | POA: Diagnosis not present

## 2016-09-09 MED FILL — MONO-LINYAH 28 TABLET: 0.25-35 | 84 days supply | Qty: 84 | Fill #1

## 2016-10-18 DIAGNOSIS — K754 Autoimmune hepatitis: Secondary | ICD-10-CM | POA: Diagnosis not present

## 2016-11-19 IMAGING — MR MR HEAD W/O CM
9 of 10 series · 38 of 48 positions shown · non-contrast
Comparison: CT head August 25, 2015 at 5281 hours

CLINICAL DATA: Acute onset RIGHT-sided numbness and tingling.
History of hepatitis.

EXAM:
MRI HEAD WITHOUT CONTRAST
TECHNIQUE: Multiplanar, multiecho pulse sequences of the brain and surrounding
structures were obtained without intravenous contrast.

[Series 3: DWI · axial · 3.0mm · 1.09mm/px · z∈[-77,+58]mm · 11 of 96 slices shown (1 of 4)]
[im 1/96]
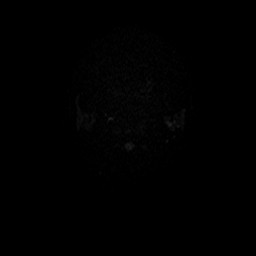
[im 10/96]
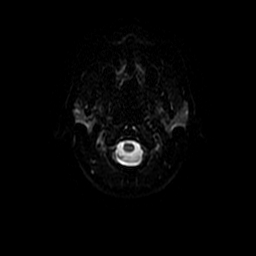
[im 20/96]
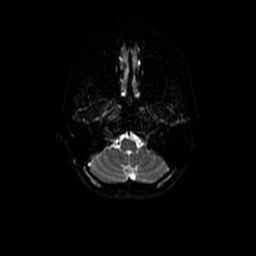
[im 29/96]
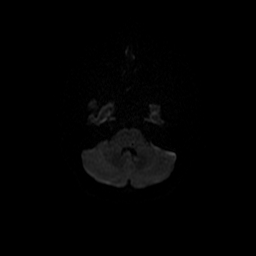
[im 39/96]
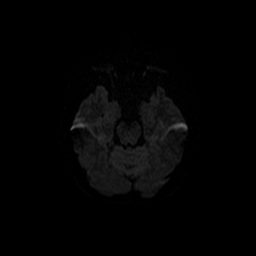
[im 48/96]
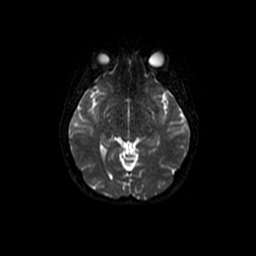
[im 58/96]
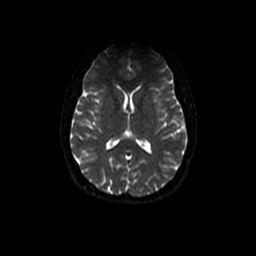
[im 67/96]
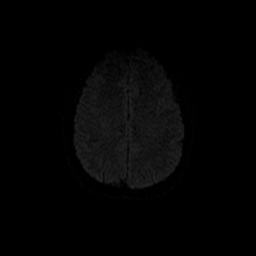
[im 77/96]
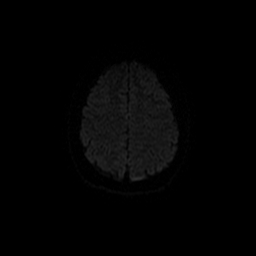
[im 86/96]
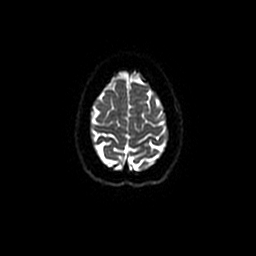
[im 96/96]
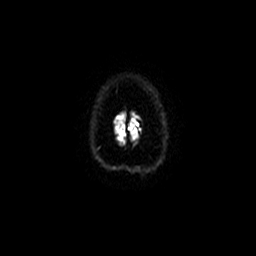

[Series 4: DWI · coronal · 5.0mm · 1.09mm/px · 8 of 66 slices shown (2 of 4)]
[im 1/66]
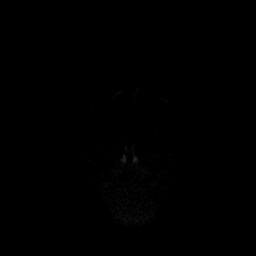
[im 10/66]
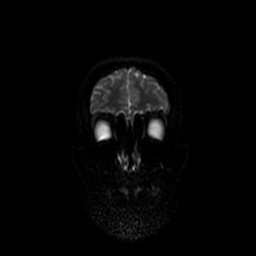
[im 19/66]
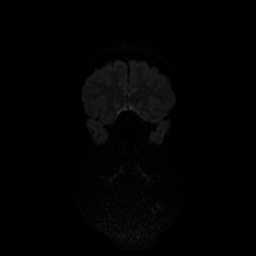
[im 28/66]
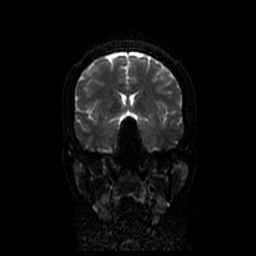
[im 38/66]
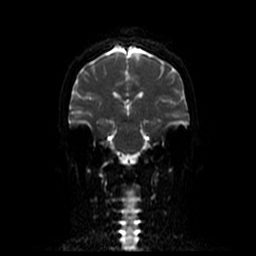
[im 47/66]
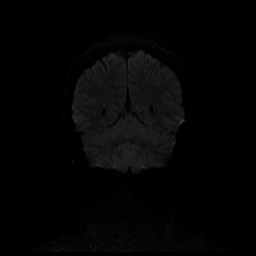
[im 56/66]
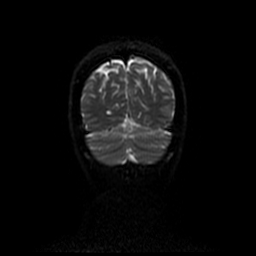
[im 66/66]
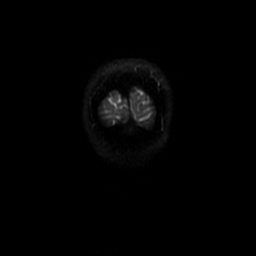

[Series 5: FLAIR · axial · 5.0mm · 0.43mm/px · z∈[-64,+68]mm · 2 of 24 slices shown]
[im 1/24]
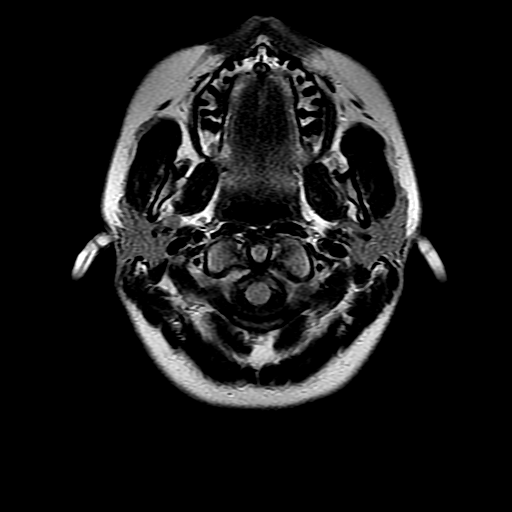
[im 24/24]
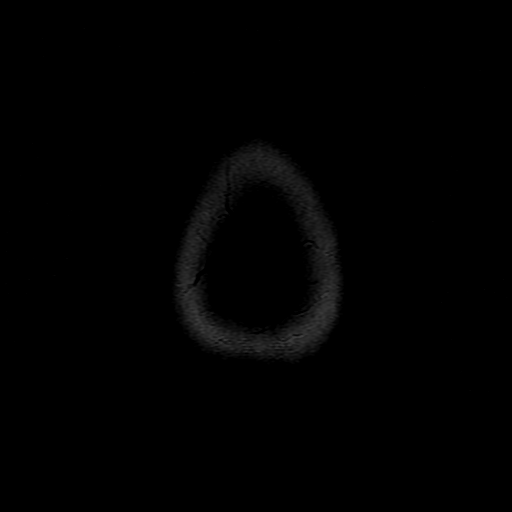

[Series 6: T1 · sagittal · 5.0mm · 0.47mm/px · 2 of 22 slices shown]
[im 1/22]
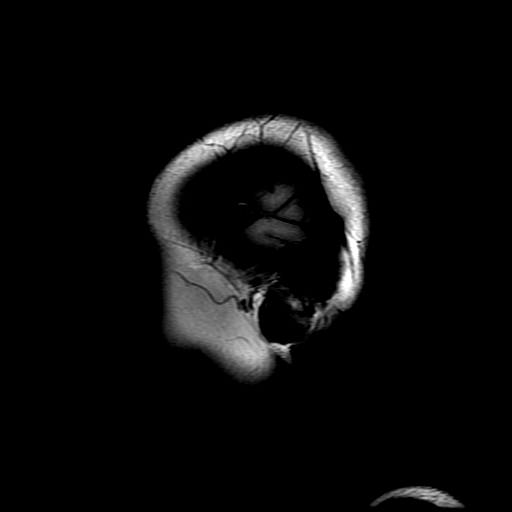
[im 22/22]
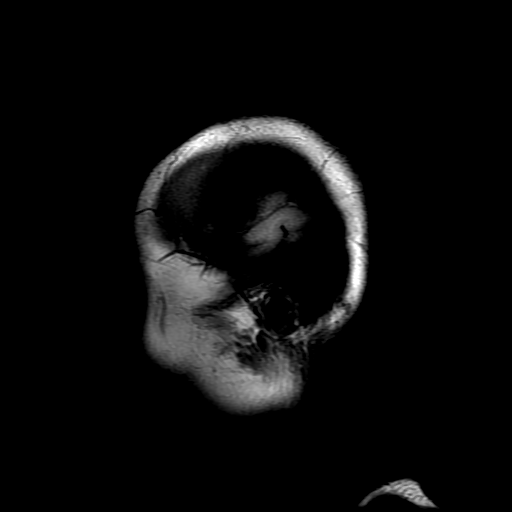

[Series 7: T2 · axial · 5.0mm · 0.43mm/px · z∈[-64,+68]mm · 2 of 24 slices shown (1 of 2)]
[im 1/24]
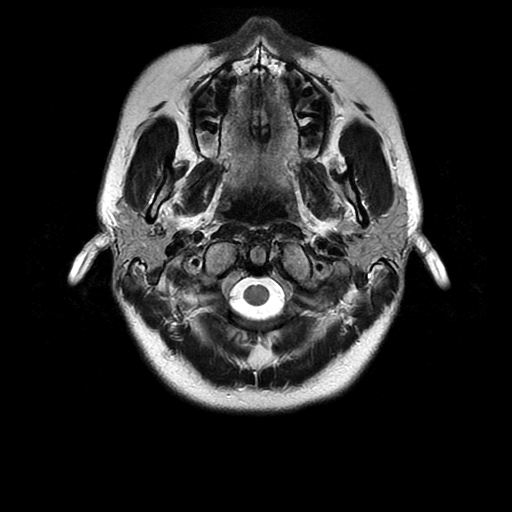
[im 24/24]
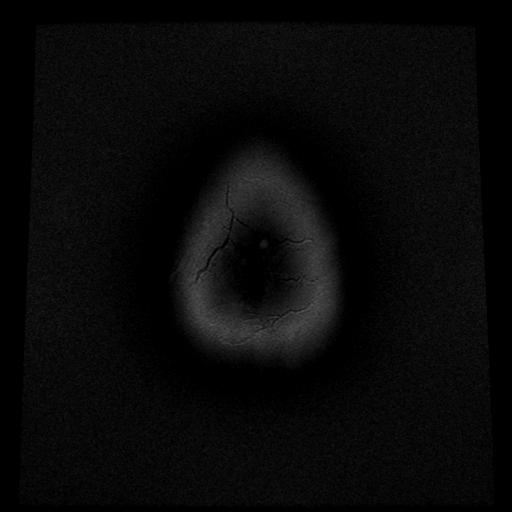

[Series 8: ax mpgr · axial · 5.0mm · 0.43mm/px · z∈[-64,+68]mm · 2 of 24 slices shown]
[im 1/24]
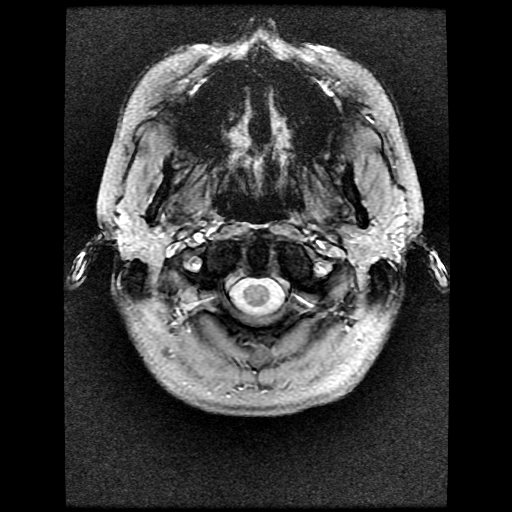
[im 24/24]
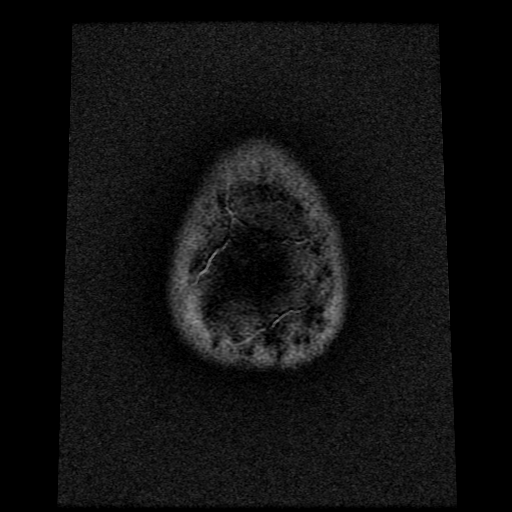

[Series 10: T2 · coronal · 5.0mm · 0.43mm/px · 3 of 28 slices shown (2 of 2)]
[im 1/28]
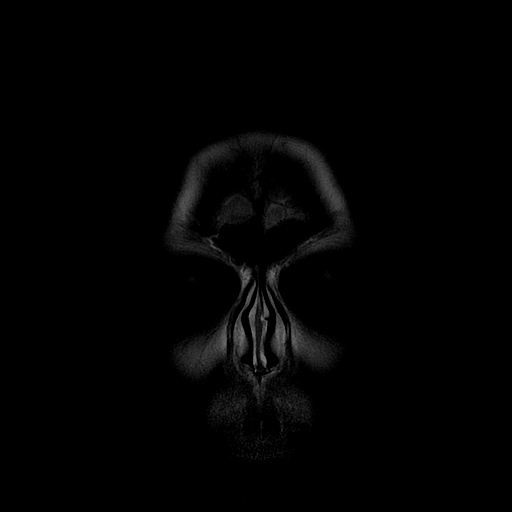
[im 14/28]
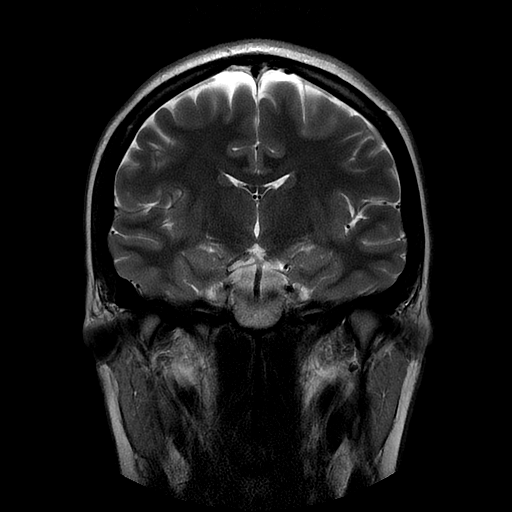
[im 28/28]
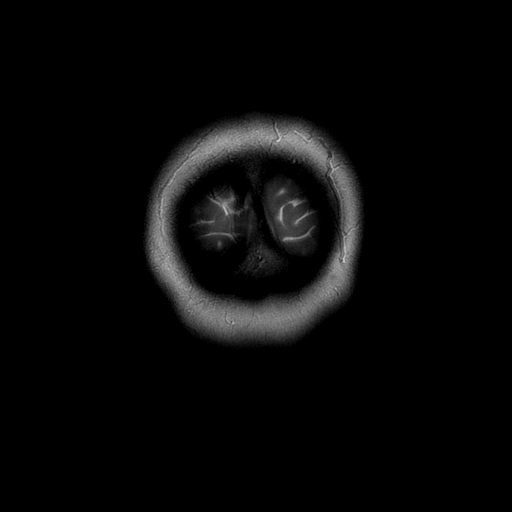

[Series 300: DWI · axial · 3.0mm · 1.09mm/px · z∈[-77,+58]mm · 5 of 48 slices shown (3 of 4)]
[im 1/48]
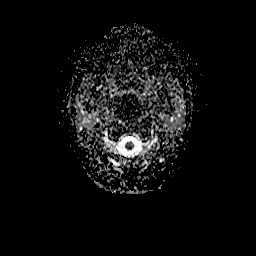
[im 12/48]
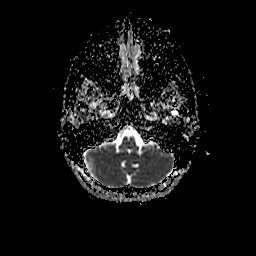
[im 24/48]
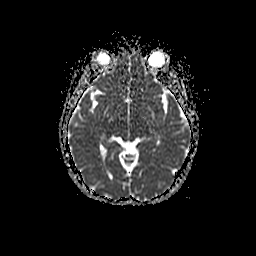
[im 36/48]
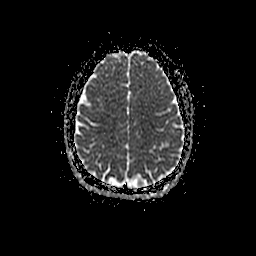
[im 48/48]
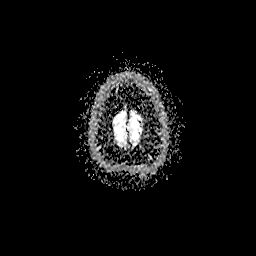

[Series 400: DWI · coronal · 5.0mm · 1.09mm/px · 3 of 33 slices shown (4 of 4)]
[im 1/33]
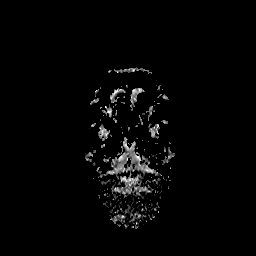
[im 17/33]
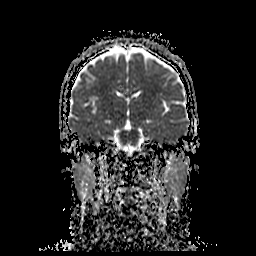
[im 33/33]
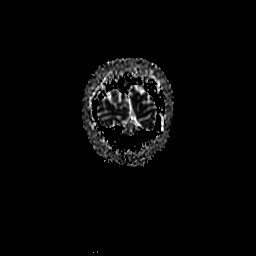

[38 of 48 positions shown; findings below may reference images not displayed]

FINDINGS: The ventricles and sulci are normal for patient's age. No abnormal
parenchymal signal, mass lesions, mass effect. No abnormal
parenchymal enhancement. No reduced diffusion to suggest acute
ischemia. No susceptibility artifact to suggest hemorrhage.

No abnormal extra-axial fluid collections. No extra-axial masses nor
leptomeningeal enhancement. Normal major intracranial vascular flow
voids seen at the skull base. Mildly tortuous LEFT vertebral artery
deforms the LEFT ventral medulla.

Ocular globes and orbital contents are unremarkable though not
tailored for evaluation. No suspicious calvarial bone marrow signal.
No abnormal sellar expansion. Craniocervical junction maintained.
Visualized paranasal sinuses and mastoid air cells are well-aerated.
IMPRESSION: Normal noncontrast MRI head.
